# Patient Record
Sex: Female | Born: 1970 | Race: White | Hispanic: No | Smoking: Current every day smoker
Health system: Southern US, Community
[De-identification: ages and names within clinical notes are randomized; demographics above are authoritative.]

## PROBLEM LIST (undated history)

## (undated) DIAGNOSIS — K219 Gastro-esophageal reflux disease without esophagitis: Secondary | ICD-10-CM

## (undated) DIAGNOSIS — J45909 Unspecified asthma, uncomplicated: Secondary | ICD-10-CM

## (undated) DIAGNOSIS — N329 Bladder disorder, unspecified: Secondary | ICD-10-CM

## (undated) DIAGNOSIS — M1990 Unspecified osteoarthritis, unspecified site: Secondary | ICD-10-CM

## (undated) DIAGNOSIS — N809 Endometriosis, unspecified: Secondary | ICD-10-CM

## (undated) DIAGNOSIS — N83209 Unspecified ovarian cyst, unspecified side: Secondary | ICD-10-CM

## (undated) DIAGNOSIS — L309 Dermatitis, unspecified: Secondary | ICD-10-CM

## (undated) DIAGNOSIS — I639 Cerebral infarction, unspecified: Secondary | ICD-10-CM

## (undated) DIAGNOSIS — E079 Disorder of thyroid, unspecified: Secondary | ICD-10-CM

## (undated) DIAGNOSIS — K5900 Constipation, unspecified: Secondary | ICD-10-CM

## (undated) DIAGNOSIS — N390 Urinary tract infection, site not specified: Secondary | ICD-10-CM

## (undated) DIAGNOSIS — T7840XA Allergy, unspecified, initial encounter: Secondary | ICD-10-CM

## (undated) DIAGNOSIS — N3090 Cystitis, unspecified without hematuria: Secondary | ICD-10-CM

## (undated) HISTORY — DX: Endometriosis, unspecified: N80.9

## (undated) HISTORY — DX: Bladder disorder, unspecified: N32.9

## (undated) HISTORY — PX: TUBAL LIGATION: SHX77

## (undated) HISTORY — PX: BLADDER SURGERY: SHX569

## (undated) HISTORY — DX: Cystitis, unspecified without hematuria: N30.90

## (undated) HISTORY — PX: LAPAROSCOPY: SHX197

## (undated) HISTORY — DX: Unspecified ovarian cyst, unspecified side: N83.209

## (undated) HISTORY — DX: Dermatitis, unspecified: L30.9

## (undated) HISTORY — DX: Urinary tract infection, site not specified: N39.0

## (undated) HISTORY — DX: Disorder of thyroid, unspecified: E07.9

## (undated) HISTORY — DX: Allergy, unspecified, initial encounter: T78.40XA

## (undated) HISTORY — DX: Unspecified osteoarthritis, unspecified site: M19.90

## (undated) HISTORY — DX: Unspecified asthma, uncomplicated: J45.909

## (undated) HISTORY — DX: Gastro-esophageal reflux disease without esophagitis: K21.9

## (undated) HISTORY — DX: Constipation, unspecified: K59.00

## (undated) HISTORY — DX: Cerebral infarction, unspecified: I63.9

---

## 1998-06-11 ENCOUNTER — Inpatient Hospital Stay (HOSPITAL_COMMUNITY): Admission: RE | Admit: 1998-06-11 | Discharge: 1998-06-11 | Payer: Self-pay | Admitting: Obstetrics

## 1999-05-26 ENCOUNTER — Inpatient Hospital Stay (HOSPITAL_COMMUNITY): Admission: AD | Admit: 1999-05-26 | Discharge: 1999-05-26 | Payer: Self-pay | Admitting: Obstetrics

## 2001-12-10 ENCOUNTER — Encounter: Payer: Self-pay | Admitting: Emergency Medicine

## 2001-12-10 ENCOUNTER — Emergency Department (HOSPITAL_COMMUNITY): Admission: EM | Admit: 2001-12-10 | Discharge: 2001-12-10 | Payer: Self-pay | Admitting: Emergency Medicine

## 2004-05-17 ENCOUNTER — Emergency Department: Payer: Self-pay | Admitting: Emergency Medicine

## 2004-12-16 ENCOUNTER — Ambulatory Visit (HOSPITAL_COMMUNITY): Admission: RE | Admit: 2004-12-16 | Discharge: 2004-12-16 | Payer: Self-pay | Admitting: Gynecology

## 2006-07-30 ENCOUNTER — Emergency Department: Payer: Self-pay | Admitting: Emergency Medicine

## 2006-08-01 ENCOUNTER — Emergency Department: Payer: Self-pay | Admitting: Emergency Medicine

## 2007-04-08 ENCOUNTER — Emergency Department: Payer: Self-pay | Admitting: Emergency Medicine

## 2007-05-01 ENCOUNTER — Emergency Department (HOSPITAL_COMMUNITY): Admission: EM | Admit: 2007-05-01 | Discharge: 2007-05-02 | Payer: Self-pay | Admitting: Emergency Medicine

## 2007-06-14 ENCOUNTER — Encounter (INDEPENDENT_AMBULATORY_CARE_PROVIDER_SITE_OTHER): Payer: Self-pay | Admitting: Nurse Practitioner

## 2007-06-14 ENCOUNTER — Ambulatory Visit: Payer: Self-pay | Admitting: Internal Medicine

## 2007-06-14 LAB — CONVERTED CEMR LAB
Alkaline Phosphatase: 51 units/L (ref 39–117)
Creatinine, Ser: 0.76 mg/dL (ref 0.40–1.20)
Eosinophils Absolute: 0.1 10*3/uL (ref 0.0–0.7)
Eosinophils Relative: 2 % (ref 0–5)
Glucose, Bld: 87 mg/dL (ref 70–99)
HCT: 37.3 % (ref 36.0–46.0)
Hemoglobin: 12.3 g/dL (ref 12.0–15.0)
Lymphs Abs: 1.7 10*3/uL (ref 0.7–4.0)
MCV: 94.7 fL (ref 78.0–100.0)
Monocytes Absolute: 0.5 10*3/uL (ref 0.1–1.0)
Platelets: 261 10*3/uL (ref 150–400)
RDW: 12.5 % (ref 11.5–15.5)
Sodium: 138 meq/L (ref 135–145)
TSH: 0.801 microintl units/mL (ref 0.350–5.50)
Total Bilirubin: 0.2 mg/dL — ABNORMAL LOW (ref 0.3–1.2)
Total Protein: 6.7 g/dL (ref 6.0–8.3)

## 2007-08-03 ENCOUNTER — Ambulatory Visit: Payer: Self-pay | Admitting: Family Medicine

## 2007-12-28 ENCOUNTER — Ambulatory Visit: Payer: Self-pay | Admitting: Obstetrics & Gynecology

## 2007-12-28 ENCOUNTER — Encounter: Payer: Self-pay | Admitting: Obstetrics & Gynecology

## 2007-12-31 ENCOUNTER — Ambulatory Visit (HOSPITAL_COMMUNITY): Admission: RE | Admit: 2007-12-31 | Discharge: 2007-12-31 | Payer: Self-pay | Admitting: Family Medicine

## 2008-03-28 ENCOUNTER — Inpatient Hospital Stay (HOSPITAL_COMMUNITY): Admission: AD | Admit: 2008-03-28 | Discharge: 2008-03-28 | Payer: Self-pay | Admitting: Obstetrics & Gynecology

## 2008-03-31 ENCOUNTER — Inpatient Hospital Stay (HOSPITAL_COMMUNITY): Admission: AD | Admit: 2008-03-31 | Discharge: 2008-03-31 | Payer: Self-pay | Admitting: Obstetrics & Gynecology

## 2008-04-09 ENCOUNTER — Ambulatory Visit: Payer: Self-pay | Admitting: Obstetrics and Gynecology

## 2008-04-09 LAB — CONVERTED CEMR LAB
Clue Cells Wet Prep HPF POC: NONE SEEN
Trich, Wet Prep: NONE SEEN
WBC, Wet Prep HPF POC: NONE SEEN
Yeast Wet Prep HPF POC: NONE SEEN

## 2008-04-23 ENCOUNTER — Ambulatory Visit: Payer: Self-pay | Admitting: Obstetrics and Gynecology

## 2008-06-16 ENCOUNTER — Ambulatory Visit: Payer: Self-pay | Admitting: Obstetrics & Gynecology

## 2008-07-17 ENCOUNTER — Ambulatory Visit: Payer: Self-pay | Admitting: Obstetrics & Gynecology

## 2008-07-24 ENCOUNTER — Inpatient Hospital Stay (HOSPITAL_COMMUNITY): Admission: EM | Admit: 2008-07-24 | Discharge: 2008-07-28 | Payer: Self-pay | Admitting: Emergency Medicine

## 2008-07-28 ENCOUNTER — Ambulatory Visit: Payer: Self-pay | Admitting: Dentistry

## 2008-07-31 ENCOUNTER — Ambulatory Visit (HOSPITAL_COMMUNITY): Admission: RE | Admit: 2008-07-31 | Discharge: 2008-07-31 | Payer: Self-pay | Admitting: Dentistry

## 2009-11-28 ENCOUNTER — Ambulatory Visit: Payer: Self-pay | Admitting: Nurse Practitioner

## 2009-11-28 ENCOUNTER — Inpatient Hospital Stay (HOSPITAL_COMMUNITY): Admission: AD | Admit: 2009-11-28 | Discharge: 2009-11-28 | Payer: Self-pay | Admitting: Obstetrics & Gynecology

## 2010-04-30 ENCOUNTER — Inpatient Hospital Stay (HOSPITAL_COMMUNITY)
Admission: AD | Admit: 2010-04-30 | Discharge: 2010-04-30 | Payer: Self-pay | Source: Home / Self Care | Attending: Obstetrics & Gynecology | Admitting: Obstetrics & Gynecology

## 2010-05-03 ENCOUNTER — Ambulatory Visit
Admission: RE | Admit: 2010-05-03 | Discharge: 2010-05-03 | Payer: Self-pay | Source: Home / Self Care | Attending: Family Medicine | Admitting: Family Medicine

## 2010-05-03 ENCOUNTER — Other Ambulatory Visit
Admission: RE | Admit: 2010-05-03 | Discharge: 2010-05-03 | Payer: Self-pay | Source: Home / Self Care | Admitting: Family Medicine

## 2010-05-13 ENCOUNTER — Ambulatory Visit: Admit: 2010-05-13 | Payer: Self-pay | Admitting: Obstetrics & Gynecology

## 2010-05-17 ENCOUNTER — Ambulatory Visit: Admit: 2010-05-17 | Payer: Self-pay | Admitting: Family Medicine

## 2010-07-02 ENCOUNTER — Other Ambulatory Visit: Payer: Self-pay | Admitting: Family Medicine

## 2010-07-02 DIAGNOSIS — Z1231 Encounter for screening mammogram for malignant neoplasm of breast: Secondary | ICD-10-CM

## 2010-07-12 LAB — DIFFERENTIAL
Basophils Relative: 0 % (ref 0–1)
Eosinophils Absolute: 0.1 10*3/uL (ref 0.0–0.7)
Eosinophils Relative: 1 % (ref 0–5)
Lymphs Abs: 3 10*3/uL (ref 0.7–4.0)
Neutrophils Relative %: 58 % (ref 43–77)

## 2010-07-12 LAB — CBC
MCH: 32.1 pg (ref 26.0–34.0)
MCHC: 34.2 g/dL (ref 30.0–36.0)
MCV: 93.7 fL (ref 78.0–100.0)
Platelets: 202 10*3/uL (ref 150–400)
RDW: 12.8 % (ref 11.5–15.5)

## 2010-07-12 LAB — GC/CHLAMYDIA PROBE AMP, GENITAL
Chlamydia, DNA Probe: NEGATIVE
GC Probe Amp, Genital: NEGATIVE

## 2010-07-12 LAB — URINALYSIS, ROUTINE W REFLEX MICROSCOPIC
Bilirubin Urine: NEGATIVE
Hgb urine dipstick: NEGATIVE
Ketones, ur: NEGATIVE mg/dL
Nitrite: NEGATIVE
Protein, ur: NEGATIVE mg/dL
Specific Gravity, Urine: 1.01 (ref 1.005–1.030)
Urobilinogen, UA: 0.2 mg/dL (ref 0.0–1.0)

## 2010-07-12 LAB — WET PREP, GENITAL

## 2010-07-14 ENCOUNTER — Ambulatory Visit (HOSPITAL_COMMUNITY)
Admission: RE | Admit: 2010-07-14 | Discharge: 2010-07-14 | Disposition: A | Discharge status: Home / Self Care | Payer: Self-pay | Source: Ambulatory Visit | Attending: Family Medicine | Admitting: Family Medicine

## 2010-07-14 DIAGNOSIS — Z1231 Encounter for screening mammogram for malignant neoplasm of breast: Secondary | ICD-10-CM

## 2010-07-16 ENCOUNTER — Other Ambulatory Visit: Payer: Self-pay | Admitting: Family Medicine

## 2010-07-16 DIAGNOSIS — R928 Other abnormal and inconclusive findings on diagnostic imaging of breast: Secondary | ICD-10-CM

## 2010-07-23 ENCOUNTER — Ambulatory Visit
Admission: RE | Admit: 2010-07-23 | Discharge: 2010-07-23 | Disposition: A | Discharge status: Home / Self Care | Payer: Self-pay | Source: Ambulatory Visit | Attending: Family Medicine | Admitting: Family Medicine

## 2010-07-23 DIAGNOSIS — R928 Other abnormal and inconclusive findings on diagnostic imaging of breast: Secondary | ICD-10-CM

## 2010-07-26 ENCOUNTER — Other Ambulatory Visit: Payer: Self-pay

## 2010-08-12 LAB — CBC
HCT: 29.4 % — ABNORMAL LOW (ref 36.0–46.0)
HCT: 35.2 % — ABNORMAL LOW (ref 36.0–46.0)
Hemoglobin: 10.4 g/dL — ABNORMAL LOW (ref 12.0–15.0)
Hemoglobin: 10.9 g/dL — ABNORMAL LOW (ref 12.0–15.0)
Hemoglobin: 12.3 g/dL (ref 12.0–15.0)
MCHC: 34.8 g/dL (ref 30.0–36.0)
MCHC: 34.9 g/dL (ref 30.0–36.0)
MCHC: 35 g/dL (ref 30.0–36.0)
MCV: 95.6 fL (ref 78.0–100.0)
RBC: 3.14 MIL/uL — ABNORMAL LOW (ref 3.87–5.11)
RBC: 3.26 MIL/uL — ABNORMAL LOW (ref 3.87–5.11)
RBC: 3.69 MIL/uL — ABNORMAL LOW (ref 3.87–5.11)
RDW: 12.6 % (ref 11.5–15.5)
RDW: 12.7 % (ref 11.5–15.5)
WBC: 8.6 10*3/uL (ref 4.0–10.5)

## 2010-08-12 LAB — URINALYSIS, ROUTINE W REFLEX MICROSCOPIC
Glucose, UA: NEGATIVE mg/dL
Ketones, ur: NEGATIVE mg/dL
Protein, ur: NEGATIVE mg/dL
Urobilinogen, UA: 0.2 mg/dL (ref 0.0–1.0)

## 2010-08-12 LAB — BASIC METABOLIC PANEL
BUN: 8 mg/dL (ref 6–23)
CO2: 22 mEq/L (ref 19–32)
CO2: 28 mEq/L (ref 19–32)
Calcium: 8.8 mg/dL (ref 8.4–10.5)
Chloride: 111 mEq/L (ref 96–112)
Creatinine, Ser: 0.66 mg/dL (ref 0.4–1.2)
Creatinine, Ser: 0.72 mg/dL (ref 0.4–1.2)
GFR calc Af Amer: >60 mL/min (ref >60)
Glucose, Bld: 86 mg/dL (ref 70–99)
Sodium: 138 mEq/L (ref 135–145)

## 2010-08-12 LAB — CULTURE, ROUTINE-ABSCESS

## 2010-08-12 LAB — FOLATE: Folate: 15.1 ng/mL

## 2010-08-12 LAB — GRAM STAIN

## 2010-08-12 LAB — DIFFERENTIAL
Basophils Relative: 0 % (ref 0–1)
Eosinophils Relative: 1 % (ref 0–5)
Lymphocytes Relative: 18 % (ref 12–46)
Monocytes Absolute: 0.9 10*3/uL (ref 0.1–1.0)
Monocytes Relative: 9 % (ref 3–12)
Neutro Abs: 7.5 10*3/uL (ref 1.7–7.7)

## 2010-08-12 LAB — COMPREHENSIVE METABOLIC PANEL
ALT: 10 U/L (ref 0–35)
Alkaline Phosphatase: 40 U/L (ref 39–117)
BUN: 9 mg/dL (ref 6–23)
CO2: 24 mEq/L (ref 19–32)
Chloride: 112 mEq/L (ref 96–112)
GFR calc non Af Amer: >60 mL/min (ref >60)
Glucose, Bld: 80 mg/dL (ref 70–99)
Potassium: 3.4 mEq/L — ABNORMAL LOW (ref 3.5–5.1)
Sodium: 140 mEq/L (ref 135–145)
Total Bilirubin: 0.5 mg/dL (ref 0.3–1.2)
Total Protein: 5.2 g/dL — ABNORMAL LOW (ref 6.0–8.3)

## 2010-08-12 LAB — RETICULOCYTES
RBC.: 3.26 MIL/uL — ABNORMAL LOW (ref 3.87–5.11)
Retic Ct Pct: 1.2 % (ref 0.4–3.1)

## 2010-08-12 LAB — ANAEROBIC CULTURE

## 2010-08-12 LAB — HEMOGLOBIN AND HEMATOCRIT, BLOOD
HCT: 37.3 % (ref 36.0–46.0)
Hemoglobin: 12.7 g/dL (ref 12.0–15.0)

## 2010-08-12 LAB — IRON AND TIBC: Saturation Ratios: 17 % — ABNORMAL LOW (ref 20–55)

## 2010-09-14 NOTE — Op Note (Signed)
[name], [name]            ACCOUNT NO.:  [id number]   MEDICAL RECORD NO.:  [id number]          PATIENT TYPE:  AMB   LOCATION:  DAY                          FACILITY:  [facility]   PHYSICIAN:  Charlynne Pander, D.D.S.DATE OF BIRTH:  1970/12/14   DATE OF PROCEDURE:  DATE OF DISCHARGE:                               OPERATIVE REPORT   PREOPERATIVE DIAGNOSES:  1. History of left facial swelling.  2. History of periapical abscess  3. Apical periodontitis.  4. Chronic periodontitis.  5. Retained root segments.  6. Accretions.   POSTOPERATIVE DIAGNOSES:  1. History of left facial swelling.  2. History of periapical abscess  3. Apical periodontitis.  4. Chronic periodontitis.  5. Retained root segments.  6. Accretions.   OPERATION:  1. Extraction of tooth numbers 13, 16, 17 and 30 with alveoloplasty.  2. Gross debridement of the remaining dentition.   SURGEON:  Charlynne Pander, D.D.S.   ASSISTANT:  Public house manager (Sales executive).   ANESTHESIA:  General anesthesia via nasoendotracheal tube.   MEDICATIONS:  1. Clindamycin 600 mg IV prior to invasive dental procedures.  2. Local anesthesia with total utilization 2 carpules each containing      34 mg of lidocaine with 0.017 mg of epinephrine as well as 2      carpules each containing 9 mg of bupivacaine with 0.009 mg of      epinephrine.   SPECIMENS:  There were 4 teeth that were discarded.   DRAINS:  None.   CULTURES:  None.   ESTIMATED BLOOD LOSS:  Less than 50 mL.   FLUIDS:  Per anesthesia records.   COMPLICATIONS:  None.   INDICATIONS:  The patient was recently admitted with a history of left  facial swelling.  The patient had an incision and drainage procedure  performed by Dr. Beverlee Nims.  Dental consultation was requested to  evaluate and rule out dental etiology and to provide dental treatment as  indicated.  The patient was examined and treatment planned for multiple  extractions with  alveoloplasty and gross debridement of the remaining  dentition.  This treatment plan was formulated to decrease the risk and  complications associated with dental infection from further affecting  the patient's systemic health.   OPERATIVE FINDINGS:  The patient was examined in operating room #6.  The  teeth were identified for extraction.  Tooth numbers 13, 16, 17 and 30  were deemed to need extraction.  The patient noted be affected by a  history of periapical abscess, apical periodontitis, chronic  periodontitis, multiple retained root segments, chronic periodontitis  and accretions.  The aforementioned necessitated removal of the  indicated teeth with alveoloplasty and gross debridement of teeth.   DESCRIPTION OF PROCEDURE:  The patient was brought to the main operating  room #6.  The patient was then placed in supine position on the  operating room table.  General anesthesia was then induced via  nasoendotracheal tube.  The patient was then for prepped and draped in  the usual manner for a dental medicine procedure.  A time-out was  performed.  The patient was identified  and procedures were verified.  The throat pack was placed at this time.  The oral cavity was then  thoroughly examined, with findings noted above.  The patient was then  ready for the dental medicine procedure as follows:   Local anesthesia was administered sequentially with a total utilization  of 2 carpules each containing 34 mg of lidocaine with 0.017 mg of  epinephrine as well as 2 carpules each containing 9 mg of bupivacaine  with 0.009 mg of epinephrine.   The maxillary left quadrant was first approached.  The left quadrant was  anesthetized utilizing the lidocaine with epinephrine via infiltration  method.  The patient was then given bilateral inferior alveolar nerve  blocks utilizing the bupivacaine with epinephrine.  Further infiltration  was then achieved utilizing lidocaine with epinephrine.   The  maxillary left quadrant was then approached.  A 15-blade incision  was made sulcularly on the buccal and palatal aspects of tooth #13.  A  surgical flap was then carefully reflected.  Appropriate amounts of  buccal and interseptal bone were removed with the surgical handpiece and  bur and copious amounts of sterile saline.  The coronal aspect of tooth  #13 was then removed at this time.  This left retained roots.  These  roots were then elevated out with a root tip pick after further bone  removal.  Alveoplasty was then performed utilizing rongeurs and bone  file.  The surgical site was then irrigated with copious amounts of  sterile saline.  The surgical site was then closed from the mesial of  #14 extended to the distal of #12.   At this point in time tooth #16 was approached.  This tooth was then  elevated with a series of straight elevators.  The coronal aspect was  then removed with a 150 forceps.  This left retained roots.  A flap was  then reflected appropriately to expose the bone and bone was removed  with a surgical handpiece and bur and copious amounts of sterile saline.  The roots were then elevated out with a root tip pick appropriately.  Alveoloplasty was then performed utilizing rongeurs and bone file.  The  surgical site was then irrigated with copious amounts of sterile saline.  The surgical site was then closed from the distal tuberosity and  extended to the distal of #15 utilizing 3-0 chromic gut suture in a  continuous interrupted suture technique x1.   At this point in time the mandibular left quadrant was approached.  A 15-  blade incision was then made from the distal #17 and extended to the  mesial #18.  The surgical flap was then carefully reflected on the  buccal and lingual aspects.  Bone was removed around the retained tooth  #17 at this time.  The tooth was then subluxated and eventually removed  with a 151 forceps without complications.  Alveoplasty was  then  performed utilizing rongeurs and bone file.  The surgical site was then  irrigated with copious amounts of sterile saline.  The surgical site was  then closed utilizing 3-0 chromic gut suture in a continuous interrupted  suture technique from the distal #17 and extended to the distal #18.   At this point in time tooth #30 was approached.  A 15-blade incision was  then made from the mesial of #32 and extended to the distal of #29.  A  surgical flap was then carefully reflected.  Appropriate amounts of bone  were then removed around  the roots associated with tooth #30.  These  roots were then subluxated and removed individually with a 151 forceps  without complications.  Alveoplasty was then performed utilizing  rongeurs and bone file.  The surgical site was then irrigated with  copious amounts of sterile saline.  The tissues were approximated and  trimmed appropriately.  The surgical site was then closed from the  mesial #32 and extended to the distal #29 utilizing 3-0 chromic gut  suture in a continuous interrupted suture technique x1.  Please note  tooth #32 does have distal caries and will need a dental restoration but  it was felt that this tooth could be saved for a future crown and bridge  restoration.   At this point in time the remaining teeth were approached.  A KaVo sonic  scaler was used to remove significant accretions.  A series of hand  curettes were then utilized to refine the removal of accretions.  The  KaVo sonic scaler was then again utilized to remove further accretions.  At this point in time the entire mouth was irrigated with copious  amounts of sterile saline.  The patient was examined for complications,  seeing none, the dental medicine procedure was deemed to be complete.  The throat pack was removed at this time.  The patient was then handed  off to the anesthesia team for final disposition.  After an appropriate  amount of time the patient was extubated  and taken to the post  anesthesia care unit with stable vital signs and good oxygenation level.  All counts were correct for the dental medicine procedure.  The patient  will be seen in approximately 1 week for evaluation for suture removal.  The patient is to continue antibiotic therapy as prescribed by Dr.  Waymon Amato and complete as indicated.  The patient has been given a pain  medication prescription by Dr. Waymon Amato, which she will fill at this  time.      Charlynne Pander, D.D.S.  Electronically Signed     RFK/MEDQ  D:  07/31/2008  T:  07/31/2008  Job:  [id number]   cc:   Marcellus Scott, MD   Jefry H. [name], MD  Fax: [phone]   Charlynne Pander, D.D.S.  Fax: [phone]

## 2010-09-14 NOTE — Op Note (Signed)
NAMESHAWAN, [name]            ACCOUNT NO.:  [id number]   MEDICAL RECORD NO.:  [id number]          PATIENT TYPE:  WOC   LOCATION:  WOC                          FACILITY:  [facility]   PHYSICIAN:  Jefry H. [name], MD     DATE OF BIRTH:  1970-08-06   DATE OF PROCEDURE:  07/24/2008  DATE OF DISCHARGE:  07/17/2008                               OPERATIVE REPORT   PREOPERATIVE DIAGNOSIS:  Left side mandibular facial abscess odontogenic  in origin.   POSTOPERATIVE DIAGNOSIS:  Left side mandibular facial abscess  odontogenic in origin.   PROCEDURE:  Incision and drainage of left facial abscess.   SURGEON:  Jefry H. [name], MD   ANESTHESIA:  General endotracheal anesthesia was used.   COMPLICATIONS:  None.   FINDINGS:  Large abscess cavity filled with purulent liquid.   The sample was sent for culture and sensitivity testing and stat Gram  stain.  Multiple severely diseased molars bilaterally and other teeth as  well.  The left posterior molar on the mandible was fractured, carious  and with some surrounding inflammation of the gingiva.   HISTORY:  This is a 40 year old lady with a long history of chronic  dental disease who has been unable to have this care for has had some  swelling of the jaw last week or so especially bad in the last 24 hours.  Panorex showed multiple periapical lucencies.  Risks, benefits, and  alternative complications of the procedure were explained to the patient  who seemed to understand and agreed to surgery.   PROCEDURE:  The patient was taken to the operating room and placed on  the operating table in supine position.  Following induction of general  endotracheal anesthesia, the left side of the face was prepped and  draped in a standard fashion.  Xylocaine with epinephrine was  infiltrated into the skin overlying the abscess.  A #15 blade was used  to create a transverse incision directly into the abscess.  The incision  was about a centimeter and half in  length.  There was immediate  outpouring of pus.  Samples were sent for culture and sensitivity  testing.  The abscess cavity was widely opened and the Morse-Andrews  suction was used to completely clean out the entire abscess cavity.  It  was then irrigated with saline using a large syringe.  A quarter inch  Penrose was placed into the depths of the wound  exited through the skin and secured in place with a nylon suture and a  safety pin.  Gauze dressing and elastic burn netting was used to hold  the dressing in place.  The patient was then awakened, extubated and  transferred to recovery in stable condition.      Jefry H. [name], MD  Electronically Signed     JHR/MEDQ  D:  07/25/2008  T:  07/26/2008  Job:  [id number]

## 2010-09-14 NOTE — H&P (Signed)
[name], [name]            ACCOUNT NO.:  [id number]   MEDICAL RECORD NO.:  [id number]          PATIENT TYPE:  INP   LOCATION:                               FACILITY:  [facility]   PHYSICIAN:  Vania Rea, M.D. DATE OF BIRTH:  [birth year]   DATE OF ADMISSION:  07/24/2008  DATE OF DISCHARGE:                              HISTORY & PHYSICAL   CHIEF COMPLAINT:  Left jaw swelling and pain.   HISTORY OF PRESENT ILLNESS:  This is a 41 year old white woman with past  medical history of dental caries and lack of dental care secondary to  lack of insurance with left mandibular tooth pain and swelling on and  off for the last approximately 6 months.  She had a tooth abscess  mentioned in an OB/GYN note dating August 2009.  She started to have  swelling and redness 4-5 days ago in the left mandibular region.  Her  pain increased, so she came to the ED.  Dr. Serena Colonel with ENT saw her  in the ED and will take her to I and D today.  The patient now in PACU  preparing for surgery.   PAST MEDICAL HISTORY:  Bilateral tubal ligation, uterine prolapse,  questionable endometriosis with chronic pelvic pain, and tooth abscess  as mentioned above.   MEDICATIONS AT HOME:  None except ibuprofen in the last several days.   MEDICATIONS IN THE EMERGENCY DEPARTMENT:  Zofran, vancomycin, Percocet,  and clindamycin.   ALLERGIES:  SULFA gives her hives, PENICILLIN gives her hives.   SOCIAL HISTORY:  She smokes half a pack a day for the last 20 years.   REVIEW OF SYSTEMS:  No fever or chills, vomiting, chest pain, shortness  of breath, leg swelling, dysuria.  Has chronic nausea and has acid  reflux.   PHYSICAL EXAMINATION:  VITAL SIGNS:  Blood pressure 129/87, pulse rate  88, respirations 16, temperature 97.7, and oxygen saturation 100% on  room air.  GENERAL:  Afebrile.  She is in no acute distress.  HEENT:  PERRLA.  Abscess on the left side of the jaw, but she can open  her mouth, swelling  around the abscess approximately at 5 cm diameter.  PULMONARY:  Clear to auscultation bilaterally.  CARDIAC:  Regular rate and rhythm.  No murmurs, rubs, or gallops.  GASTROINTESTINAL:  Soft, nontender, nondistended.  Bowel sounds  positive.  EXTREMITIES:  No edema.  NEUROLOGIC:  Nonfocal.   LABORATORY DATA:  X-ray, orthopantogram, extensive dental caries of left  mandibular wisdom tooth and abnormal periapical lucency near this, and  other dental roots, consider subperiosteal dental abscess in this  region.  White count 10.5 with 72% PMNs, hemoglobin 12.3, MCV 95.2, and platelets  196.   ASSESSMENT AND PLAN:  This is a 40 year old white woman with past  medical history of dental caries and with lack of dental care presenting  with periosteal dental abscess.  1. Dental abscess with extension to periosteum (per orthopantogram),      secondary to unresolved chronic left wisdom tooth infection.  She      received vancomycin and clindamycin in the  ED and will continue to      receive Percocet for pain control in the ED and will continue along      with morphine IV for pain.  We will also use Flora-Q to attenuate      gut flora depletion by the antibiotic.  We will also use ibuprofen      (with Protonix) for swelling and pain, and Tylenol for fever.  Dr.      [name] saw the patient in the ED and will take her to surgery      tonight for I and D.  The patient will also need dental consult      with Dr. Verdie Shire in a.m.  We will admit her to regular floor.  We      will hydrate her and keep her n.p.o. for now.  2. Tobacco use.  We will offer nicotine patch.  3. Prophylaxis.  We will place her on SCDs and Protonix.   The case was discussed with Dr. Orvan Falconer.      Carlus Pavlov, M.D.  Electronically Signed      Vania Rea, M.D.  Electronically Signed    CG/MEDQ  D:  07/24/2008  T:  07/25/2008  Job:  [id number]

## 2010-09-14 NOTE — Group Therapy Note (Signed)
[name], [name]            ACCOUNT NO.:  [id number]   MEDICAL RECORD NO.:  [id number]          PATIENT TYPE:  WOC   LOCATION:  [facility]                   FACILITY:  [facility]   PHYSICIAN:  Scheryl Darter, MD       DATE OF BIRTH:  03/02/1971   DATE OF SERVICE:                                  CLINIC NOTE   HISTORY:  The patient returns today for followup from pessary placement.  The patient was seen by Dr. Okey Dupre April 23, 2008.  She has a pessary  in place for pelvic prolapse.  She says that sometimes it causes some  discomfort and she feels it is shifting in position.  Last period was  June 23, 2008.  The patient has problems with constipation, pelvic  pain, urinary frequency, nocturia, urge.  She has some numbness in her  legs and pain in her lower back and buttocks.  She consulted Dr. Marice Potter on  June 16, 2008 and it was not felt that hysterectomy was likely to be  of benefit with her symptoms.  She states that Dr. Mia Creek had  diagnosed endometriosis in the past and she had a laparoscopy.  Reviewing her records, I see no biopsy evidence of endometriosis.   I performed pelvic exam and removed her pessary.  She did not have any  abnormal discharge.  There was some blood which may represent onset of  her menses.  She has minimal relaxation of the vaginal vault anteriorly  and posteriorly.  The uterus appears to be well supported.  Upon  standing, there is perhaps a first-degree cystocele.   We discussed her constellation of symptoms.  We elected to keep the  pessary out today.  As she has significant urinary symptoms and she may  have interstitial cystitis, I made an  urology referral.  I recommended  that she take Metamucil or some other bulk-forming laxative on a daily  basis.  She will return to review her symptoms in 4 weeks.      Scheryl Darter, MD     JA/MEDQ  D:  07/17/2008  T:  07/17/2008  Job:  [id number]

## 2010-09-14 NOTE — Group Therapy Note (Signed)
[name], Vargas            ACCOUNT NO.:  [id number]   MEDICAL RECORD NO.:  [id number]          PATIENT TYPE:  WOC   LOCATION:  [facility]                   FACILITY:  [facility]   PHYSICIAN:  Allie Bossier, MD        DATE OF BIRTH:  1971/01/18   DATE OF SERVICE:  06/16/2008                                  CLINIC NOTE   [name] is a 40 year old G2 P2 thin lady who was seen by Dr. Okey Dupre  on 04/09/2008 for followup of her MAU visit.  She was seen in the MAU  twice in the latter part of 2009 for pain in her pelvis as well as  chronic vulvar burning.  This is also vaginal burning.  Please note  that Dr. Mia Creek had seen the patient in the past and had done a  laparoscopy and said that she had superficial endometriosis.  There is  no pathology report to support this.  She has been given a pessary at 1  point, but Dr. Okey Dupre increased the size of the pessary and gave her  another #7 to see if that would help her complaint of uterine prolapse.  He felt that she had second-degree uterine prolapse.  He wrote that he  thought that she would do well with a TAH-BSO if her problem is truly  secondary to endometriosis.  He told her that she might be helped by a  tricyclic antidepressant, however, she has not done that.  When I first  came in the room, she said that her pessary has helped her periods to  be regular and now her bladder function is normal.  She is in fact  scheduled to have a hysterectomy by me tomorrow.  Please note that I did  not schedule the surgery, but it was scheduled by Dr. Okey Dupre.  Upon  further review of systems, she tells me she has numbness in her legs for  the last approximately 6 weeks.  She says that when she sit straight up,  she has a stabbing pain in her butt and she also complains of lower  back pain.  She is somewhat fixated on the fact that Dr. Mia Creek told  her that she has endometriosis.  She is afraid that the endometriosis is  causing her long-term harm.  I  have explained at length that the  endometriosis that he diagnosed visually was listed as mild and that she  should not be having any long-term complications from this.  Please note  Dr. Mia Creek gave her 72 Percocet as a renewal on her standard Percocet  use.  After my neurologic exam and discussion with [name], I am  not at all convinced that a hysterectomy and bilateral salpingo-  oophorectomy would offer her any benefit whatsoever.  I feel that the  majority of her pelvic pain is from a disk injury or disk disease and  especially the vulva at the burning in her vulva as well as the  numbness in her size as well as her back pain as well as her pelvic  pain.  I have refused to operate on her unless we  do a trial of Lupron  and only I would operate on her if the Lupron gave her significant  relief of her pain.  She declines the Lupron and in fact wants to see  another surgeon, I have certainly supported this decision and I have  cancelled her surgery for tomorrow.      Allie Bossier, MD     MCD/MEDQ  D:  06/16/2008  T:  06/17/2008  Job:  [id number]

## 2010-09-14 NOTE — Discharge Summary (Signed)
[name], DANIS            ACCOUNT NO.:  [id number]   MEDICAL RECORD NO.:  [id number]          PATIENT TYPE:  INP   LOCATION:  5151                         FACILITY:  [facility]   PHYSICIAN:  Marcellus Scott, MD     DATE OF BIRTH:  1970-05-19   DATE OF ADMISSION:  07/24/2008  DATE OF DISCHARGE:  07/28/2008                               DISCHARGE SUMMARY   PRIMARY MEDICAL DOCTOR:  Gentry Fitz.   DISCHARGE DIAGNOSES:  1. Left perimandibular odontogenic abscess, status post incision and      drainage.  2. Dental caries.  For outpatient extraction.  3. Anemia.  4. Tobacco abuse.  5. Hypokalemia, repleted.   DISCHARGE MEDICATIONS:  1. Clindamycin 300 mg p.o. q.i.d. for 1 week.  2. Florastor 250 mg p.o. daily for 1 week.  3. Motrin 200 mg tablets 2 p.o. t.i.d. with meals p.r.n. for mild      pain.  4. Percocet 5/325, one to 2 tablets p.o. q.i.d. p.r.n. for moderate to      severe pain, 30 tablets prescribed.   PROCEDURES:  1. Incision and drainage of left facial abscess by Dr. [name] on July 24, 2008.  2. Orthopantogram:  Impression:  Multifocal dental caries and abnormal      periapical dental lucencies.  On the left, mandibular wisdom tooth      involved, consider subperiosteal dental abscess at this level.  In      light of the clinical history, a dental consult was recommended.   PERTINENT LABORATORIES:  Basic metabolic panel today unremarkable with  BUN 8, creatinine 0.66.  CBCs with hemoglobin 10.9, hematocrit 31.2,  white blood cells 6.8, platelets 195.  Abscess culture has been  reincubated and pending.  Anemia panel with absolute reticulocytes 39.1,  iron 40, total iron-binding capacity 230, percentage saturation 17,  vitamin B12 337, serum folate 15.1 and ferritin 64.  Hepatic panel was  unremarkable except for albumin of 2.8 and total protein of 5.2.  Urinalysis negative for features of urinary tract infection.  Gram-  stain:  Few WBCs, predominantly polymorphs.   Rare Gram-positive cocci in  pairs, rare Gram-negative rods and rare Gram-positive rods.   CONSULTATIONS:  1. ENT, Dr. [name] and Jenne Pane.  2. Oral surgery, Dr. Kristin Bruins.   HOSPITAL COURSE AND PATIENT DISPOSITION:  [name] is a pleasant 40-  year-old Caucasian female patient with history of dental caries and lack  of dental care secondary to unavailability of insurance, who has been  having left-sided mandibular tooth pain and swelling on and off, ongoing  for 6 months prior to admission..  She started having swelling and  redness 4-5 days prior to admission in the left mandibular region.  She  presented to the emergency room.  She was admitted by the hospitalist  service.  Dr. [name] took her to surgery and performed an incision and  drainage.  He placed a drain.  She was then admitted to the medical  floor.   1. Left perimandibular odontogenic abscess:  The patient was admitted      to the hospital.  As  indicated, she had incision and drainage done.      She was empirically placed on IV vancomycin and clindamycin.  With      these measures, her swelling, redness and pain progressively      continued to improve.  By the next day of surgery, her drain had      fallen out, which was removed.  Packing was done with a gauze.      Over the weekend, I spoke with Dr. Jenne Pane regarding her management.      Our concern was that because of the patient's lack of insurance and      primary medical doctor, she would have a tough time getting her      dental issues addressed.  If this was not addressed, she was likely      to return with recurrent abscess.  He recommended obtaining a      dental consult in house.  Over the weekend, however, after a lot of      effort I was unable to find oral surgeon on call.  I even discussed      this with the hospital administrator on call.  This morning, I was      able to reach Dr. Kristin Bruins who kindly saw the patient.  He      suggested that the earliest  that he would be able to operate on her      would be Thursday.  He indicated that this could be done as an      outpatient.  He discussed this with the patient who was agreeable      to this plan.  I had discussed this option with Dr. Jenne Pane this      morning, and he was also agreeable to this plan.  She will be      discharged on a week's course of clindamycin.  Dr. Luretha Murphy      office will call for preoperative testing.  She is scheduled to      have her surgery on July 31, 2008 at 7:30 a.m. at [facility].  The patient indicates that she will be able to purchase      her medications.  Also, I had spoken with Dr. Jenne Pane who had      indicated that she really does not need to be followed by them.  In      any event, she can call them on an as-needed basis.  He indicates      that gauze can be removed tomorrow and there is no need of      continuous packing.  2. Dental caries:  Management as per problem #1.  3. Anemia, which is probably of chronic disease versus iron deficiency      anemia:  The patient has been advised to follow up this as an      outpatient and will need to be evaluated.  Also to find a primary      medical doctor.  4. Tobacco abuse:  Cessation counseling done.  Patient declines the      nicotine patch.   The patient at this time is stable to discharge home and follow up with  Dr. Kristin Bruins as an outpatient.   Time taken in coordinating this discharge was 35 minutes.      Marcellus Scott, MD  Electronically Signed     AH/MEDQ  D:  07/28/2008  T:  07/28/2008  Job:  [id number]   cc:   Jefry H. [name], MD  Antony Contras, MD  Charlynne Pander, D.D.S.

## 2010-09-14 NOTE — Consult Note (Signed)
[name], [name]            ACCOUNT NO.:  [id number]   MEDICAL RECORD NO.:  [id number]          PATIENT TYPE:  INP   LOCATION:                               FACILITY:  [facility]   PHYSICIAN:  Charlynne Pander, D.D.S.DATE OF BIRTH:  [birth year]   DATE OF CONSULTATION:  DATE OF DISCHARGE:                                 CONSULTATION   REFERRING PHYSICIAN:  Marcellus Scott, MD   [name] is a 40 year old female referred by Dr. Waymon Amato for  a dental consultation.  The patient was recently admitted with left  facial swelling and underwent an incision and drainage procedure by Dr.  Serena Colonel.  A dental consultation was then requested to rule out  dental etiology and provide treatment as indicated.   MEDICAL HISTORY:  1. History of left facial swelling.      a.     Status post incision and drainage procedure of a left facial       abscess by Dr. Serena Colonel on July 24, 2008.  The patient is       currently on IV antibiotic therapy.  2. History of uterine prolapse.  3. Status post bilateral tubal ligation.  4. Questionable endometriosis with chronic pelvic pain.   ALLERGIES/ADVERSE DRUG REACTION:  1. SULFA causes hives.  2. PENICILLIN causes hives.   MEDICATIONS:  1. Protonix 40 mg daily.  2. Nicotine patch daily.  3. Florastor 250 mg daily.  4. Vancomycin 1000 mg IV every 12 hours.  5. Clindamycin 600 mg IV every 6 hours.  6. Percocet 5/325 one to two every 4 hours as needed for pain.   SOCIAL HISTORY:  The patient is married with 2 children.  The patient  smokes approximately 1/2 pack per day for 20 years.   FUNCTIONAL ASSESSMENT:  The patient remains independent for ADLs.   FAMILY HISTORY:  Noncontributory.   REVIEW OF SYSTEMS:  This is reviewed from the chart for this admission.   DENTAL HISTORY:   CHIEF COMPLAINT:  The patient is complaining of history of toothache and  left facial swelling.   HISTORY OF PRESENT ILLNESS:  The patient gives a history  of toothache  involving a lower left molar since August 2009.  The pain has been  intermittent in nature.  Recently it worsened to the point where it  reached a 10/10 intensity.  The patient then sought treatment through  the emergency room and was admitted and an incision and drainage  procedure performed by Dr. [name].  The patient then placed on IV  antibiotic therapy and pain medication.  The patient indicates the pain  is currently 3/10 in intensity.  The patient points to tooth #17 as the  offending tooth but also is complaining of intermittent pain coming from  tooth #13 and 30.  The patient indicates that she last saw a dentist  approximately 3 years ago for an exam and cleaning by a dentist in  [place], [place].  The patient has been unable to see the  dentist due to economic concerns.   DENTAL EXAM:  GENERAL:  The patient is  a well-developed, well-nourished  female in no acute distress.  VITAL SIGNS:  Blood pressure is 112/67, pulse equal to 60, respirations  are 18, temperature is 98.4.  HEAD AND NECK EXAM:  Left neck and face has a bandage over it consistent  with previous incision and drainage procedure.  There is no significant  right neck lymphadenopathy noted at this time.  The patient denies  symptoms of trismus.  The patient is able to open approximately 30 to 40  mm.  The patient denies acute TMJ symptoms at this time.  INTRAORAL EXAM:  Patient with no evidence of intraoral abscess  formation.  DENTITION:  The patient is missing tooth #3 and 31.  Tooth #13 and 30  are present as retained root segments.  There are multiple dental caries  noted.  PERIODONTAL:  Patient with chronic periodontitis and plaque and calculus  accumulations, selective areas of gingival recession and tooth mobility.  DENTAL CARIES:  There are multiple dental caries noted.  The patient  would need a full series of dental radiographs to identify the extent of  the dental caries.   ENDODONTIC:  Patient with a history of acute pulpitis symptoms involving  the left mandible and facial area.  Patient with multiple areas of  periapical pathology involving tooth #13, 17 and 30.  CROWN AND BRIDGE:  There are no crown or bridge restorations noted.  PROSTHODONTIC:  The patient denies presence of partial dentures.  OCCLUSION:  Patient with a poor occlusal scheme secondary to multiple  missing teeth, supereruption and drifting of the unopposed teeth into  the edentulous areas and lack of replacement of the missing teeth with  dental prostheses.   RADIOGRAPHIC INTERPRETATION:  A panoramic x-ray was taken on July 24, 2008.   There are multiple missing teeth.  There are multiple retained root  segments.  There are dental caries noted.  There are multiple areas of  periapical pathology.  There is incipient to moderate bone loss noted.   ASSESSMENT:  1. History of left facial swelling with tooth #17 as the most likely      source of a dental etiology.  There is a periapical radiolucency at      the apices of tooth #17.  2. Chronic periodontitis.  3. Plaque and calculus accumulations.  4. Rampant dental caries.  5. Multiple areas of periapical pathology.  6. Multiple missing teeth.  7. Supereruption and drifting of the unopposed teeth into the      edentulous areas.  8. No history of partial dentures.  9. Poor occlusal scheme.  10.History of oral neglect.   PLAN/RECOMMENDATIONS:  1. I discussed the risks, benefits and complications of various      treatment options with the patient in relationship to her medical      and dental conditions.  We discussed various treatment options to      include no treatment, multiple extractions with alveoloplasty,      preprosthetic surgery as indicated, periodontal therapy, dental      restorations, root canal therapy, crown and bridge therapy, implant      therapy, replacing missing teeth as indicated.  The patient      currently  wishes to proceed with extraction of indicated teeth with      alveoloplasty and gross debridement of the remaining dentition.      This treatment will be provided as an outpatient through the      operating room at [facility].  I have talked with Dr.      Waymon Amato, and he will discharge the patient today from [facility]      and will be kept on oral antibiotic therapy.  The patient will then      be seen this Thursday, April 1, in the operating room at [facility] to proceed with extraction of the indicated teeth      with alveoloplasty and gross debridement of teeth.  2. Discussion of findings with Dr. [name], Dr. Waymon Amato and other      medical team as indicated.  3. The patient will then follow up with the dentist of her choice for      an exam, dental x-rays and overall treatment planning discussions.      Charlynne Pander, D.D.S.  Electronically Signed     RFK/MEDQ  D:  07/29/2008  T:  07/29/2008  Job:  [id number]   cc:   Marcellus Scott, MD  Jefry H. [name], MD  [facility]

## 2010-09-14 NOTE — Group Therapy Note (Signed)
NAMEADELAI, [name]            ACCOUNT NO.:  [id number]   MEDICAL RECORD NO.:  [id number]          PATIENT TYPE:  WOC   LOCATION:  [facility]                   FACILITY:  [facility]   PHYSICIAN:  Elsie Lincoln, MD      DATE OF BIRTH:  1970/12/17   DATE OF SERVICE:                                  CLINIC NOTE   The patient is a 40 year old para 2-0-2-2 who presents for multiple  complaints:  1. She has a tooth abscess and no dental insurance and no money to go      see a doctor.  2. She has severe abdominal pain.  She has been diagnosed by Dr.      Mia Creek 5 years ago at the [facility]  office with      endometriosis.  I do not have these records and we are getting      them.  She is to be on birth control pills which helped and      narcotics also helped the pain.  3. She has vaginal burning and suprapubic pain.  4. She has dyspareunia.  5. The patient denies dysuria when she urinates and she also does not      complain of frequency.  She does not believe that she has an      infection.   PAST MEDICAL HISTORY:  Pelvic pain, endometriosis, and denies other  medical problems.   PAST SURGICAL HISTORY:  She has had a laparoscopy, a bilateral tubal  ligation, and hysteroscopy.   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   MENSTRUAL HISTORY:  The patient has been having irregular periods for  many years approximately 2 a month.  She has also not had Pap smear in 5  years.  She has had abnormal Pap smears in 2002.  She does not admit to  any freezing or burning of her cervix.   HEALTHCARE MAINTENANCE:  She has never had a colonoscopy or a mammogram.   SOCIAL HISTORY:  The patient smokes half pack per day for 19 years.  She  has 1 alcoholic beverage a week.  She does not drink caffeinated  beverages.  She has never been physically or sexually abused.   MEDICATIONS:  Ibuprofen.   ALLERGIES:  No allergy to LATEX.  She  has an allergy to SULFA.  She does  not believe she has an allergy to PENICILLIN.   Systemic review is positive for numbness and weakness in fingers,  swelling in legs, muscle aches, fevers, night sweats, fatigue, frequent  headaches, dizzy spells, problems with nose and smell, coughing up  phlegm or blood, chest pain, problems urination, loss of urine while  coughing, sneezing, hot flashes, and pain with intercourse.  Today, we  are addressing some of her OB/GYN complaints.   PHYSICAL EXAMINATION:  VITAL SIGNS:  Temperature 97.0, pulse 69, blood  pressure 110/69, weight 172.8, and height 5 feet 3 inches.  HEENT:  Normocephalic.  There is swelling in the left lower jaw where  her teeth seems to be inflamed and it is tender.  No pus drain from her  gums.  CHEST:  Normal movements.  ABDOMEN:  Soft and nontender.  No rebound.  No organomegaly.  No hernia.  GENITALIA:  Tanner 5.  Vulva is excoriated or torn at the fourchette.  A  wet Q-tip test shows very burning at the vestibule and anteriorly  towards the urethra.  Vagina appear slightly atrophic.  There is mild  prolapse of the uterus and mild cystocele.  On bimanual, the uterus  appears still small, anteverted, and nontender.  Adnexa, left is more  tender than right, but no masses, no guarding.  The patient is most  tender suprapubically on the urethra and bladder.  EXTREMITIES:  Nontender.   ASSESSMENT AND PLAN:  A 40 year old female with pelvic pain,  endometriosis, vaginal burning, tooth abscess, dyspareunia, and abnormal  uterine bleeding.  1. Pap smear, wet prep, and GC chlamydia culture sent.  2. Urinalysis and urine culture sent.  3. Transvaginal ultrasound ordered.  4. The patient is to come back in 2 weeks for these results and also      for colposcopy of the vulva and endometrial biopsy.  This should be      a 1-hour appointment.  5. The patient was given amoxicillin and Vicodin for her tooth pain.      She understands  this a one-time deal.  This will not be repeated as      we are not dentist, but I understand that she does not have health      insurance or access to any dental care.  Again, she states she is      not allergic to penicillin.  6. If it is interstitial cystitis, and the urinalysis and urine      culture comes back negative, we will refer to Urology.  7. The patient could have some precancerous cell in her vulva.  She      does need a colposcopy, but also there could just be a vulvar      vestibulitis/vulvodynia.           ______________________________  Elsie Lincoln, MD     KL/MEDQ  D:  12/28/2007  T:  12/29/2007  Job:  [id number]

## 2010-09-14 NOTE — Group Therapy Note (Signed)
NAME:  [name], [name]            ACCOUNT NO.:  [id number]   MEDICAL RECORD NO.:  [id number]          PATIENT TYPE:  WOC   LOCATION:  [facility]                   FACILITY:  [facility]   PHYSICIAN:  Argentina Donovan, MD        DATE OF BIRTH:  02-Nov-1970   DATE OF SERVICE:  04/09/2008                                  CLINIC NOTE   The patient is a 40 year old para 2-0-2-2 Caucasian female who has been  in the [facility] twice within the last few weeks with severe lower abdominal  pain.  Was seen by one of the nurse practitioners who thought the  patient had a uterine prolapse and placed a pessary which gave the  patient a significant amount of relief.  She has also been troubled with  chronic vaginal burning inside of the vagina posterior and anterior  wall, and has a positive Q-tip test.  She had a history of abnormal Pap  smears in the past, had colposcopy and had some kind of treatment.  Dr.  Mia Creek had done some treatments on this patient and we do not have the  record at this point.  I am going to request that we get the record.  He  had treated the patient, did a laparoscopy and said she had  endometriosis according to the patient.   EXAMINATION:  On examination the patient does show signs of a second-  degree uterine prolapse which may have been what is giving her some  relief with a pessary.  Although the pessary was floating, I put one in  that was slightly larger, a 7 cm flat pessary to see if that would  improve her symptoms even to a greater extant until we could decide on  what procedure she needs as far as surgical.   My impression is that this patient would do well with a hysterectomy and  bilateral salpingo-oophorectomy if truly her problem is secondary to  endometriosis.  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  I have told her that maybe that would be helped by a  tricyclic type of antidepressant and we will consider doing that when  she comes back.  I am  going to have her come back in 2 weeks in order to  give [place] time to obtain the records and decide whether or not vaginal  hysterectomy with bilateral salpingo-oophorectomy or laparoscopic  vaginal hysterectomy with bilateral salpingo-oophorectomy might be the  better choice for this patient.  Although her pain is also suprapubic,  she does not have any of the other signs of interstitial cystitis, so I  do not think that that is really a question.  Her last Pap smear turned  out to be normal and she has no signs that I can see of any leukoplakial  changes on the vulva or inside the vagina.   IMPRESSION:  Chronic pelvic pain with vaginal vulvar burning, uterine  prolapse.           ______________________________  Argentina Donovan, MD     PR/MEDQ  D:  04/09/2008  T:  04/09/2008  Job:  [id number]

## 2010-09-17 NOTE — Op Note (Signed)
[name], [name]            ACCOUNT NO.:  [id number]   MEDICAL RECORD NO.:  [id number]          PATIENT TYPE:  AMB   LOCATION:  [facility]                           [facility]   PHYSICIAN:  Ginger Carne, MD  DATE OF BIRTH:  July 30, 1970   DATE OF PROCEDURE:  12/16/2004  DATE OF DISCHARGE:                                 OPERATIVE REPORT   PREOPERATIVE DIAGNOSIS:  Request for permanent sterilization.   POSTOPERATIVE DIAGNOSIS:  Request for permanent sterilization.   PROCEDURE:  Laparoscopic bilateral tubal banding.   SURGEON:  Ginger Carne, M.D.   ASSISTANT:  None.   COMPLICATIONS:  None immediate.   ESTIMATED BLOOD LOSS:  Minimal.   ANESTHESIA:  General with Marcaine 10 mL 0.5% 1:200,000 for subcuticular  injection.   SPECIMENS:  None.   FINDINGS:  Uterus, tubes, and ovaries demonstrate evidence of stage II  endometriosis which had been previously recognized.  There were punctate  lesions along the broad ligaments.  Both tubes were identified from their  isthmus to fimbriated ends and separate and apart from their respective  round ligaments.  External genitalia, vulva, and vagina were normal.   DESCRIPTION OF PROCEDURE:  The patient was prepped and draped in the usual  fashion and placed in the lithotomy position.  Betadine solution used for  antiseptic and the patient was catheterized prior to the procedure.  Afterward a vertical infraumbilical incision was made and the Veress needle  placed in the abdomen.  Opening and closing pressures were 10 to 15  mmHg.  Needle released and trocar placed through the same incision and laparoscope  placed in the trocar sleeve.  Under direct visualization, an 8 mm probe was  placed in the left lower quadrant for administration of said Falope rings.  Once again both tubes were carefully identified from their isthmus to  fimbriated ends separate and apart from their respective round ligaments.  Bands were placed on either  side at the isthmus ampullary junction, no  bleeding noted, and they were well applied.  Gas released, trocars removed,  and  closure of the 10 mm fascia site with 0 Vicryl suture and 4-0 Vicryl for  subcuticular closure.  Needle, sponge, and instrument counts correct.  The  patient tolerated the procedure well and return to the postanesthesia  recovery room in excellent condition.      Ginger Carne, MD  Electronically Signed     SHB/MEDQ  D:  12/16/2004  T:  12/16/2004  Job:  [id number]

## 2010-11-26 DIAGNOSIS — R102 Pelvic and perineal pain: Secondary | ICD-10-CM | POA: Insufficient documentation

## 2010-11-26 DIAGNOSIS — K5909 Other constipation: Secondary | ICD-10-CM

## 2010-11-26 DIAGNOSIS — N809 Endometriosis, unspecified: Secondary | ICD-10-CM | POA: Insufficient documentation

## 2010-11-26 DIAGNOSIS — N816 Rectocele: Secondary | ICD-10-CM

## 2010-11-26 DIAGNOSIS — IMO0002 Reserved for concepts with insufficient information to code with codable children: Secondary | ICD-10-CM

## 2010-12-03 ENCOUNTER — Telehealth: Payer: Self-pay | Admitting: Obstetrics and Gynecology

## 2010-12-03 NOTE — Telephone Encounter (Signed)
Patient wanted to state how important this appointment is for her because of her numerous complaints but she does not have insurance of any sort nor money to pay out of pocket. Advised patient that we are not turning her away and we provide financial aid. We will give her the paperwork at time of visit. Patient agrees.

## 2010-12-09 ENCOUNTER — Ambulatory Visit: Payer: Self-pay | Admitting: Family Medicine

## 2011-01-05 ENCOUNTER — Other Ambulatory Visit: Payer: Self-pay | Admitting: Family Medicine

## 2011-01-05 DIAGNOSIS — R928 Other abnormal and inconclusive findings on diagnostic imaging of breast: Secondary | ICD-10-CM

## 2011-01-13 ENCOUNTER — Telehealth: Payer: Self-pay | Admitting: *Deleted

## 2011-01-13 ENCOUNTER — Ambulatory Visit: Payer: Self-pay | Admitting: Obstetrics & Gynecology

## 2011-01-13 NOTE — Telephone Encounter (Signed)
Pt. Called today at 8:44 am and left a message she has an appointment scheduled for today at 2;45 to see doctor to schedule surgery- states she wants to come in, but has had a death in the family and can't come in- states she knows if she reschedules it will be a long while from now and she is in a lot of pain and doesn't want to wait along time again- states she has been told she has endometriosis and also a bulge in her rectum from childbirth.  Discussed with manager and appointment given for 02-16-11 at 2pm . Called pt to notify we can reschedule and appt given. Pt. States worried about putting it off because family member died from cervical cancer. Pt states did not receive her last pap result-notified pt last pap results negative in Jan 2012. Informed pt she has to decide which appointment she wants today or February 16, 2011 and she always has the option to come to [facility] anytime if she feels she needs to based on severe pain,etc. Pt. Decided to change to 2011-02-16 appt because of death in the family and has a lot of family commitments and understands she can come to [facility] if she feels she needs to before then.

## 2011-02-02 LAB — WET PREP, GENITAL: Clue Cells Wet Prep HPF POC: NONE SEEN

## 2011-02-02 LAB — URINALYSIS, ROUTINE W REFLEX MICROSCOPIC
Bilirubin Urine: NEGATIVE
Hgb urine dipstick: NEGATIVE
Protein, ur: NEGATIVE
Specific Gravity, Urine: 1.01
Urobilinogen, UA: 0.2

## 2011-02-02 LAB — GC/CHLAMYDIA PROBE AMP, GENITAL: Chlamydia, DNA Probe: NEGATIVE

## 2011-02-03 ENCOUNTER — Ambulatory Visit (INDEPENDENT_AMBULATORY_CARE_PROVIDER_SITE_OTHER): Payer: Self-pay | Admitting: Obstetrics and Gynecology

## 2011-02-03 ENCOUNTER — Encounter: Payer: Self-pay | Admitting: Obstetrics and Gynecology

## 2011-02-03 DIAGNOSIS — K5909 Other constipation: Secondary | ICD-10-CM

## 2011-02-03 DIAGNOSIS — K5900 Constipation, unspecified: Secondary | ICD-10-CM

## 2011-02-03 DIAGNOSIS — R109 Unspecified abdominal pain: Secondary | ICD-10-CM

## 2011-02-03 DIAGNOSIS — G8929 Other chronic pain: Secondary | ICD-10-CM

## 2011-02-03 DIAGNOSIS — N816 Rectocele: Secondary | ICD-10-CM

## 2011-02-03 DIAGNOSIS — IMO0002 Reserved for concepts with insufficient information to code with codable children: Secondary | ICD-10-CM

## 2011-02-03 DIAGNOSIS — R300 Dysuria: Secondary | ICD-10-CM

## 2011-02-03 DIAGNOSIS — N8111 Cystocele, midline: Secondary | ICD-10-CM

## 2011-02-03 DIAGNOSIS — N809 Endometriosis, unspecified: Secondary | ICD-10-CM

## 2011-02-03 NOTE — Progress Notes (Signed)
  Subjective:    Patient ID: [name], female    DOB: [birth year], 40 y.o.   MRN: [id number]  HPI 40 yo [id number] with LMP 01/31/2011 presenting today for follow-up on her endometriosis and requesting hysterectomy. Patient was last seen in January where a GYN exam was performed. Patient states that since her visit she continues to experience pelvic pain on and off. She continues to experience constipation and dysuria and frequency. Patient describes generalized pelvic pain that does not seem to be associated with her menses. Patient reports a long standing history of endometriosis for which she was medically managed with OCP but cannot afford to buy them.  Patient also reports h/o uterine prolase for which she was prescribed a pessary. Patient still has the pessary at home but has not used it a long time.   Review of Systems As stated in HPI, otherwise negative    Objective:   Physical Exam  GENERAL: Well-developed, well-nourished female in no acute distress.  HEENT: Normocephalic, atraumatic. Sclerae anicteric.  NECK: Supple. Normal thyroid.  LUNGS: Clear to auscultation bilaterally.  HEART: Regular rate and rhythm. BREASTS: Symmetric in size. No palpable masses or lymphadenopathy, skin changes, or nipple drainage. ABDOMEN: Soft, nontender, nondistended. No organomegaly. PELVIC: Normal external female genitalia. Vagina is pink and rugated.  Normal discharge. Normal appearing cervix. Uterus is normal in size. No adnexal mass or tenderness. Cystocele and rectocele noted with stool in rectal vault. No uterine prolapse appreciated. EXTREMITIES: No cyanosis, clubbing, or edema, 2+ distal pulses.       Assessment & Plan:  40 yo [phone] with chronic abdominal pain, constipation, cystocele and rectocele  - Because of the unclear etiology of her pelvic pain- not necessarily related to endometriosis- patient referred to gastroenterology - It was explained to the patient that if no other  etiology of her pain was discovered that a hysterectomy could be entertained with anterior and posterior repair. Patient also understands that if endometriosis is not causing her pelvic pain, the hysterectomy will not resolve her chronic pain issue. - patient was also offered Depo-Lupron to manage her endometriosis but the patient declined. - a urine culture was ordered to r/o uti as the etiology of her dysuria

## 2011-02-03 NOTE — Progress Notes (Signed)
Pt refused GI ref. States does not have the money for visit and finds unnecessary. Pt filled out Lup[place] application but not sure if wants that injection at this time. Pt wants to know if there is any kind of assistance besides medicaid and assistance through the hospital. Advised the pt the GI ref would be $120 but pt can not afford. Pt is to return after GI visit.

## 2011-02-03 NOTE — Progress Notes (Signed)
C/o burning pain in lower back intermittently, and when it occurs states it makes her feel flushed and swell in her head. Used to see Dr. Mia Creek - has done several surgeries.

## 2011-02-03 NOTE — Patient Instructions (Signed)
Constipation in Adults Constipation is having fewer than 2 bowel movements per week. Usually, the stools are hard. As we grow older, constipation is more common. If you try to fix constipation with laxatives, the problem may get worse. This is because laxatives taken over a long period of time make the colon muscles weaker. A low-fiber diet, not taking in enough fluids, and taking some medicines may make these problems worse. MEDICATIONS THAT MAY CAUSE CONSTIPATION  Water pills (diuretics).  Calcium channel blockers (used to control blood pressure and for the heart).   Certain pain medicines (narcotics).   Anticholinergics.  Anti-inflammatory agents.   Antacids that contain aluminum.   DISEASES THAT CONTRIBUTE TO CONSTIPATION  Diabetes.  Parkinson's disease.   Dementia.   Stroke.  Depression.   Illnesses that cause problems with salt and water metabolism.   HOME CARE INSTRUCTIONS  Constipation is usually best cared for without medicines. Increasing dietary fiber and eating more fruits and vegetables is the best way to manage constipation.   Slowly increase fiber intake to 25 to 38 grams per day. Whole grains, fruits, vegetables, and legumes are good sources of fiber. A dietitian can further help you incorporate high-fiber foods into your diet.   Drink enough water and fluids to keep your urine clear or pale yellow.   A fiber supplement may be added to your diet if you cannot get enough fiber from foods.   Increasing your activities also helps improve regularity.   Suppositories, as suggested by your caregiver, will also help. If you are using antacids, such as aluminum or calcium containing products, it will be helpful to switch to products containing magnesium if your caregiver says it is okay.   If you have been given a liquid injection (enema) today, this is only a temporary measure. It should not be relied on for treatment of longstanding (chronic) constipation.    Stronger measures, such as magnesium sulfate, should be avoided if possible. This may cause uncontrollable diarrhea. Using magnesium sulfate may not allow you time to make it to the bathroom.  SEEK IMMEDIATE MEDICAL CARE IF:  There is bright red blood in the stool.   The constipation stays for more than 4 days.   There is belly (abdominal) or rectal pain.   You do not seem to be getting better.   You have any questions or concerns.  MAKE SURE YOU:  Understand these instructions.   Will watch your condition.   Will get help right away if you are not doing well or get worse.  Document Released: 01/15/2004 Document Re-Released: 07/13/2009 [facility] Patient Information 2011 [place], [place].Pelvic Pain Pelvic pain is pain below the belly button and located between your hips. Acute pain may last a few hours or days. Chronic pelvic pain may last weeks and months. The cause these different types of pelvic may be different. The pain may be dull or sharp, mild or severe and can interfere with your daily activities. Write down and tell your caregiver:   Exactly where the pain is located.   If it comes and goes or is there all the time.   When it happens (with sex, urination, bowel movement, etc.)   If the pain is related to your menstrual period or stress.  Your caregiver will take a full history and do a complete physical exam and Pap test. CAUSES  Painful menstrual periods (dysmenorrhea).   Normal ovulation (Mittelschmertz) that occurs in the middle of the menstrual cycle every month.  The pelvic organs get engorged with blood just before the menstrual period (pelvic congestive syndrome).   Scar tissue from an infection or past surgery (pelvic adhesions).   Cancer of the female pelvic organs. When there is pain with cancer, it has been there for a long time.   The lining of the uterus (endometrium) abnormally grows in places like the pelvis and on the pelvic organs  (endometriosis).   A form of endometriosis with the lining of the uterus present inside of the muscle tissue of the uterus (adenomyosis).   Fibroid tumor (noncancerous) in the uterus.   Bladder problems such as infection, bladder spasms of the muscle tissue of the bladder.   Intestinal problems (irritable bowel syndrome, colitis, an ulcer or gastrointestinal infection).   Polyps of the cervix or uterus.   Pregnancy in the tube (ectopic pregnancy).   The opening of the cervix is too small for the menstrual blood to flow through it (cervical stenosis).   Physical or sexual abuse (past or present).   Musculo-skeletal problems from poor posture, problems with the vertebrae of the lower back or the uterine pelvic muscles falling (prolapse).   Psychological problems such as depression or stress.   IUD (intrauterine device) in the uterus.  DIAGNOSIS Tests to make a diagnosis depends on the type, location, severity and what causes the pain to occur. Tests that may be needed include:  Blood tests.  Urine tests   Ultrasound.   X-rays.  CT Scan.   MRI.  Laparoscopy.   Major surgery.   TREATMENT Treatment will depend on the cause of the pain, which includes:  Prescription or over-the-counter pain medication.  Antibiotics.   Birth control pills.   Hormone treatment.  Nerve blocking injections.   Physical therapy.   Antidepressants.  Counseling with a psychiatrist or psychologist.   Minor or major surgery.   HOME CARE INSTRUCTIONS  Only take over-the-counter or prescription medicines for pain, discomfort or fever as directed by your caregiver.   Follow your caregiver's advice to treat your pain.   Rest.   Avoid sexual intercourse if it causes the pain.   Apply warm or cold compresses (which ever works best) to the pain area.   Do relaxation exercises such as yoga or meditation.   Try acupuncture.   Avoid stressful situations.   Try group therapy.   If  the pain is because of a stomach/intestinal upset, drink clear liquids, eat a bland light food diet until the symptoms go away.  SEEK MEDICAL CARE IF:  You need stronger prescription pain medication.   You develop pain with sexual intercourse.   You have pain with urination.   You develop a temperature of 100.4 with the pain.   You are still in pain after 4 hours of taking prescription medication for the pain.   You need depression medication.   Your IUD is causing pain and you want it removed.  SEEK IMMEDIATE MEDICAL CARE IF YOU DEVELOP:  Very severe pain or tenderness.   Fainting, chills, severe weakness or dehydration.   Heavy vaginal bleeding or passing solid tissue.   You develop a temperature of 100.4 with the pain.   You have blood in the urine.   You are being physically or sexually abused.   You have uncontrolled vomiting and diarrhea.   You are depressed and afraid of harming yourself or someone else.  Document Released: 05/26/2004 Document Re-Released: 02/19/2008 [facility] Patient Information 2011 [place], [place].

## 2011-02-04 LAB — POCT URINALYSIS DIP (DEVICE)
Glucose, UA: NEGATIVE mg/dL
Glucose, UA: NEGATIVE
Hgb urine dipstick: NEGATIVE
Nitrite: NEGATIVE
Nitrite: POSITIVE — AB
Protein, ur: NEGATIVE mg/dL
Protein, ur: NEGATIVE
Urobilinogen, UA: 0.2 mg/dL (ref 0.0–1.0)
Urobilinogen, UA: 0.2
pH: 6.5

## 2011-02-04 LAB — CBC
MCHC: 34.5
Platelets: 257
RBC: 4.3
RDW: 13.5

## 2011-02-04 LAB — DIFFERENTIAL
Basophils Absolute: 0
Basophils Relative: 0
Eosinophils Absolute: 0.1
Neutro Abs: 6.4
Neutrophils Relative %: 69

## 2011-02-04 LAB — RPR: RPR Ser Ql: NONREACTIVE

## 2011-02-04 LAB — I-STAT 8, (EC8 V) (CONVERTED LAB)
Glucose, Bld: 96
Potassium: 4.5
TCO2: 26
pCO2, Ven: 34.8 — ABNORMAL LOW
pH, Ven: 7.461 — ABNORMAL HIGH

## 2011-02-04 LAB — WET PREP, GENITAL
Clue Cells Wet Prep HPF POC: NONE SEEN
Trich, Wet Prep: NONE SEEN
Yeast Wet Prep HPF POC: NONE SEEN

## 2011-02-04 LAB — POCT I-STAT CREATININE: Operator id: 294501

## 2011-06-04 ENCOUNTER — Emergency Department: Payer: Self-pay | Admitting: Emergency Medicine

## 2011-06-04 LAB — COMPREHENSIVE METABOLIC PANEL
BUN: 13 mg/dL (ref 7–18)
Chloride: 104 mmol/L (ref 98–107)
EGFR (African American): >60
EGFR (Non-African Amer.): >60
Glucose: 95 mg/dL (ref 65–99)
SGOT(AST): 14 U/L — ABNORMAL LOW (ref 15–37)
SGPT (ALT): 22 U/L
Total Protein: 7.9 g/dL (ref 6.4–8.2)

## 2011-06-04 LAB — CBC
MCH: 31.7 pg (ref 26.0–34.0)
MCV: 93 fL (ref 80–100)
Platelet: 222 10*3/uL (ref 150–440)
RDW: 13.1 % (ref 11.5–14.5)

## 2011-06-04 LAB — URINALYSIS, COMPLETE
Bacteria: NONE SEEN
Bilirubin,UR: NEGATIVE
Blood: NEGATIVE
Glucose,UR: NEGATIVE mg/dL (ref 0–75)
Nitrite: NEGATIVE
Specific Gravity: 1.015 (ref 1.003–1.030)
Squamous Epithelial: <1
WBC UR: 2 /HPF (ref 0–5)

## 2011-06-04 LAB — PREGNANCY, URINE: Pregnancy Test, Urine: NEGATIVE m[IU]/mL

## 2011-06-28 ENCOUNTER — Emergency Department: Payer: Self-pay | Admitting: Emergency Medicine

## 2011-07-25 ENCOUNTER — Other Ambulatory Visit: Payer: Self-pay | Admitting: Obstetrics and Gynecology

## 2011-07-25 DIAGNOSIS — R922 Inconclusive mammogram: Secondary | ICD-10-CM

## 2011-07-25 DIAGNOSIS — R9230 Dense breasts, unspecified: Secondary | ICD-10-CM

## 2011-07-26 ENCOUNTER — Ambulatory Visit: Payer: Self-pay

## 2011-08-12 ENCOUNTER — Emergency Department: Payer: Self-pay | Admitting: Emergency Medicine

## 2011-08-12 LAB — BASIC METABOLIC PANEL
BUN: 8 mg/dL (ref 7–18)
Calcium, Total: 8.4 mg/dL — ABNORMAL LOW (ref 8.5–10.1)
Chloride: 109 mmol/L — ABNORMAL HIGH (ref 98–107)
Creatinine: 0.76 mg/dL (ref 0.60–1.30)
EGFR (African American): >60
Glucose: 83 mg/dL (ref 65–99)
Potassium: 3.9 mmol/L (ref 3.5–5.1)
Sodium: 143 mmol/L (ref 136–145)

## 2011-08-12 LAB — CBC
HGB: 12 g/dL (ref 12.0–16.0)
MCHC: 33.6 g/dL (ref 32.0–36.0)
MCV: 93 fL (ref 80–100)
RBC: 3.84 10*6/uL (ref 3.80–5.20)
RDW: 13.2 % (ref 11.5–14.5)
WBC: 8.4 10*3/uL (ref 3.6–11.0)

## 2011-08-12 LAB — URINALYSIS, COMPLETE
Bacteria: NONE SEEN
Blood: NEGATIVE
Leukocyte Esterase: NEGATIVE
Nitrite: NEGATIVE
Ph: 7 (ref 4.5–8.0)
Squamous Epithelial: 3
WBC UR: 1 /HPF (ref 0–5)

## 2011-08-12 LAB — TROPONIN I: Troponin-I: <0.02 ng/mL

## 2011-08-12 LAB — CK TOTAL AND CKMB (NOT AT ARMC): CK, Total: 159 U/L (ref 21–215)

## 2011-09-21 ENCOUNTER — Telehealth: Payer: Self-pay

## 2011-09-21 NOTE — Telephone Encounter (Signed)
Pt called and stated that she has several questions about what's going on with her and she does not have insurance "can a nurse please give me a call back."

## 2011-09-21 NOTE — Telephone Encounter (Signed)
Called pt and left message that we are returning her call to please return our call to the clinics.

## 2011-09-21 NOTE — Telephone Encounter (Signed)
Late entry 1130am- Pt returned call and informed me that she is having severe pain where sometimes it is difficult for her to walk.  She states she has endometriosis and that she has been to the ER several times in [place] concerning about the problems that she is having.  She also states that the last time she was here @ the clinics they were talking about a hysterectomy and they referred her to a GI doctor in which she did not go to the appt due to her stating the she does not have insurance.  Pt stated that she spoke with someone at the front desk here @ the clinics and they said that there was not an appt available until the end of June being of July.  I offered pt the financial assistance paperwork and pt stated that her husband would not let her fill out due to the info that it requests (tax forms, etc.)  Pt stated a concern of having to continue to go to the ER and it costing.  I advised pt that she could make an appt here and she would need to bring in $20 to the office visit to be seen and that the $20 does not cover the visit but it is like a good faith payment and the rest of visit billing she can make payments to [facility] billing.  Per Mayra Neer, we got an earlier appt for June 7 @ 1015am.  Pt stated that she would work the appt out due to plans she has made.  I informed pt that that is earliest appt available.  I advised pt to come to her appt and to fill out her financial assistance paperwork just in cause it possibly may lead to surgery she would have some coverage. Pt stated understanding and had no further questions.

## 2011-10-07 ENCOUNTER — Ambulatory Visit: Payer: Self-pay | Admitting: Obstetrics and Gynecology

## 2011-11-01 ENCOUNTER — Emergency Department (HOSPITAL_COMMUNITY)
Admission: EM | Admit: 2011-11-01 | Discharge: 2011-11-01 | Disposition: A | Discharge status: Home / Self Care | Payer: Self-pay | Attending: Emergency Medicine | Admitting: Emergency Medicine

## 2011-11-01 ENCOUNTER — Encounter (HOSPITAL_COMMUNITY): Payer: Self-pay | Admitting: *Deleted

## 2011-11-01 DIAGNOSIS — F17200 Nicotine dependence, unspecified, uncomplicated: Secondary | ICD-10-CM | POA: Insufficient documentation

## 2011-11-01 DIAGNOSIS — M549 Dorsalgia, unspecified: Secondary | ICD-10-CM | POA: Insufficient documentation

## 2011-11-01 LAB — URINALYSIS, ROUTINE W REFLEX MICROSCOPIC
Bilirubin Urine: NEGATIVE
Glucose, UA: NEGATIVE mg/dL
Leukocytes, UA: NEGATIVE
Nitrite: NEGATIVE
Specific Gravity, Urine: 1.013 (ref 1.005–1.030)
pH: 6 (ref 5.0–8.0)

## 2011-11-01 MED ORDER — TRAMADOL HCL 50 MG PO TABS: 50.0000 mg | ORAL_TABLET | Freq: Four times a day (QID) | ORAL | Status: AC | PRN

## 2011-11-01 NOTE — ED Notes (Signed)
Pt states she fell in her [place] a month ago and then she fell in [employer]. Pt states she was seen [place]. Pt states she has worsen back pain. Pt states she thinks the pain in back is making her nausea. No emsis.

## 2011-11-01 NOTE — ED Provider Notes (Signed)
History     CSN: [id number]  Arrival date & time 11/01/11  1423   First MD Initiated Contact with Patient 11/01/11 1717      Chief Complaint  Patient presents with  . Fall    (Consider location/radiation/quality/duration/timing/severity/associated sxs/prior treatment) Patient is a 41 y.o. female presenting with fall. The history is provided by the patient.  Fall The accident occurred more than 1 week ago. The fall occurred while walking. She landed on concrete. The pain is at a severity of 10/10. Associated symptoms include nausea. Pertinent negatives include no fever, no numbness and no hematuria.   41 y/o female INAD c/o right thoracic  back pain x 1 month s/p slip and fall on concrete. Pt was seen at [facility] ED and XR showed arthritis, no fracture,as per Pt. She is concerned becuase the pain is not improving. Pt states that when the affected area is pressed she releases flatus. Pt reports nausea x1 month and urinary frequency. Pt also reports scabs to scalp x1 month that heal when she washes her hair. Denies red flags all for back pain  Past Medical History  Diagnosis Date  . Allergy   . Bladder disorder   . UTI (lower urinary tract infection)   . Bladder infection   . Eczema     Past Surgical History  Procedure Date  . Bladder surgery   . Laparoscopy     Family History  Problem Relation Age of Onset  . Heart disease Father   . GER disease Father   . Hypertension Father   . GER disease Mother   . GER disease Sister   . GER disease Brother     History  Substance Use Topics  . Smoking status: Current Everyday Smoker -- 0.5 packs/day  . Smokeless tobacco: Not on file  . Alcohol Use: 2.4 oz/week    2 Cans of beer, 2 Glasses of wine per week    OB History    Grav Para Term Preterm Abortions TAB SAB Ect Mult Living   4 3 3  1 1  0   2      Review of Systems  Constitutional: Negative for fever and unexpected weight change.  Respiratory: Negative for shortness  of breath.   Gastrointestinal: Positive for nausea.  Genitourinary: Positive for frequency. Negative for hematuria and flank pain.  Musculoskeletal: Positive for back pain. Negative for gait problem.  Skin:       Scabs to head  Neurological: Negative for numbness.  All other systems reviewed and are negative.    Allergies  Codeine and Sulfa antibiotics  Home Medications   Current Outpatient Rx  Name Route Sig Dispense Refill  . IBUPROFEN 200 MG PO TABS Oral Take 600 mg by mouth every 6 (six) hours as needed. pain    . ONE-DAILY MULTI VITAMINS PO TABS Oral Take 1 tablet by mouth daily.     Marland Kitchen OVER THE COUNTER MEDICATION Oral Take 1 tablet by mouth daily as needed. Acid reducer      BP 122/82  Pulse 83  Temp 98.3 F (36.8 C) (Oral)  Resp 16  SpO2 97%  LMP 10/20/2011  Physical Exam  Nursing note and vitals reviewed. Constitutional: She is oriented to person, place, and time. She appears well-developed and well-nourished. No distress.  HENT:  Head: Normocephalic and atraumatic.  Mouth/Throat: Oropharynx is clear and moist.  Eyes: Conjunctivae and EOM are normal. Pupils are equal, round, and reactive to light.  Neck: Normal range of  motion. Neck supple.  Cardiovascular: Normal rate and regular rhythm.   Pulmonary/Chest: Effort normal.  Abdominal: Soft. Bowel sounds are normal. She exhibits no distension and no mass. There is no tenderness. There is no rebound and no guarding.  Musculoskeletal: Normal range of motion.       Diffuse tenderness to lower thoracic paraspinal muscles.   Neurological: She is alert and oriented to person, place, and time. No cranial nerve deficit.       CN 3-12 intact  Skin:       Scattered scabbing to scalp. No signs of deep tissue infection, redness, erythema or warmth  Psychiatric: She has a normal mood and affect. Her behavior is normal.    ED Course  Procedures (including critical care time)   Labs Reviewed  URINALYSIS, ROUTINE W REFLEX  MICROSCOPIC  LAB REPORT - SCANNED   No results found.   1. Back pain       MDM  Chronic back pain with no associated symptoms concerning for acute process. Will control pain and give resource guide for outpatient care.  Discussed case with attending Dr. Juleen China who agrees with plan. Pt verbalized understanding and agrees with care plan. Outpatient follow-up and return precautions given.           Wynetta Emery, PA-C 11/04/11 1034

## 2011-11-01 NOTE — Progress Notes (Signed)
Pt confirms she is a [place] pt from [place] also close to [place] Pt seen by [employer] and offered information for local [place] pcps/services.  ED CM spoke with pt in details about her concern with seeing a pcp.  Reports she feels she is not receiving services she should be receiving. Pt states she has a hx of endometriosis dx by a provider in [place] who changed to [facility] prior to her being able to obtain OB GYN surgery.  [facility] "[facility]ro [facility] for three months only" Pt reports she does not work but her husband does She reports she has been seen [facility] MD, "Dr Ursula Beath" who had her to sit with a financial counselor who gave her a package of financial papers but she did not complete and follow up because her "husband does not want to give his financial information" "he is just like that" Cm encouraged pt to return to [facility] to speak with staff about her OB GYN concerns although she also voiced concern of back pain during this ED visit and pt continues to state she "believe this is all because of my endometriosis" Informed pt that [facility] does see self pay patients.  Provided pt with a list of self pay pcps for [place] ([facility], [facility], [facility] clinic in [place], [place] and [facility]. Pt reports she is familiar with [facility] and has heard of [facility].  Pt inquired if there were any facility or other service that could assist her with medicaid application Cm informed pt that the DSS is the primary location to go to get medicaid application completed.  Pt does not have access to Internet CM informed pt she could also access her local library to obtain Internet access at hourly intervals.  Pt voiced understanding and appreciation of written resources provided and questions answered

## 2011-11-09 NOTE — ED Provider Notes (Signed)
Medical screening examination/treatment/procedure(s) were performed by non-physician practitioner and as supervising physician I was immediately available for consultation/collaboration.  China Deitrick, MD 11/09/11 0926 

## 2012-01-12 ENCOUNTER — Telehealth (HOSPITAL_COMMUNITY): Payer: Self-pay | Admitting: *Deleted

## 2012-01-12 NOTE — Telephone Encounter (Signed)
Telephoned patient at mobile # and left message to return call to [facility] 

## 2012-01-13 ENCOUNTER — Encounter (HOSPITAL_COMMUNITY): Payer: Self-pay | Admitting: Family

## 2012-01-13 ENCOUNTER — Inpatient Hospital Stay (HOSPITAL_COMMUNITY)
Admission: AD | Admit: 2012-01-13 | Discharge: 2012-01-13 | Disposition: A | Discharge status: Home / Self Care | Payer: Medicaid Other | Source: Ambulatory Visit | Attending: Obstetrics & Gynecology | Admitting: Obstetrics & Gynecology

## 2012-01-13 ENCOUNTER — Inpatient Hospital Stay (HOSPITAL_COMMUNITY): Payer: Medicaid Other

## 2012-01-13 DIAGNOSIS — K5909 Other constipation: Secondary | ICD-10-CM

## 2012-01-13 DIAGNOSIS — R102 Pelvic and perineal pain: Secondary | ICD-10-CM

## 2012-01-13 DIAGNOSIS — N938 Other specified abnormal uterine and vaginal bleeding: Secondary | ICD-10-CM | POA: Insufficient documentation

## 2012-01-13 DIAGNOSIS — N939 Abnormal uterine and vaginal bleeding, unspecified: Secondary | ICD-10-CM

## 2012-01-13 DIAGNOSIS — K5900 Constipation, unspecified: Secondary | ICD-10-CM | POA: Insufficient documentation

## 2012-01-13 DIAGNOSIS — N949 Unspecified condition associated with female genital organs and menstrual cycle: Secondary | ICD-10-CM | POA: Insufficient documentation

## 2012-01-13 DIAGNOSIS — K5904 Chronic idiopathic constipation: Secondary | ICD-10-CM

## 2012-01-13 DIAGNOSIS — R109 Unspecified abdominal pain: Secondary | ICD-10-CM | POA: Insufficient documentation

## 2012-01-13 LAB — CBC
MCV: 93.7 fL (ref 78.0–100.0)
Platelets: 223 10*3/uL (ref 150–400)
RDW: 13.4 % (ref 11.5–15.5)
WBC: 6.5 10*3/uL (ref 4.0–10.5)

## 2012-01-13 LAB — URINALYSIS, ROUTINE W REFLEX MICROSCOPIC
Bilirubin Urine: NEGATIVE
Nitrite: NEGATIVE
Specific Gravity, Urine: 1.01 (ref 1.005–1.030)
Urobilinogen, UA: 0.2 mg/dL (ref 0.0–1.0)

## 2012-01-13 LAB — URINE MICROSCOPIC-ADD ON

## 2012-01-13 LAB — WET PREP, GENITAL: Clue Cells Wet Prep HPF POC: NONE SEEN

## 2012-01-13 MED ORDER — KETOROLAC TROMETHAMINE 60 MG/2ML IM SOLN
60.0000 mg | Freq: Once | INTRAMUSCULAR | Status: AC
  Administered 2012-01-13: 60 mg via INTRAMUSCULAR
  Filled 2012-01-13: qty 2

## 2012-01-13 MED ORDER — DICLOFENAC SODIUM 75 MG PO TBEC: 75.0000 mg | DELAYED_RELEASE_TABLET | Freq: Two times a day (BID) | ORAL | Status: AC

## 2012-01-13 NOTE — MAU Provider Note (Signed)
I was present for the exam and agree with above.  [place], [place] 01/13/2012 8:51 PM

## 2012-01-13 NOTE — MAU Provider Note (Signed)
History     CSN: [id number]  Arrival date and time: 01/13/12 0954   None     No chief complaint on file.  HPI Patient presents to the MAU c/o lower abdominal pain and abnormal vaginal bleeding that has been present for a few days.  States she has had an abnormal period over the past couple of days.  Has had intermittent periods of flow and spotting.  Has noticed some black particles in the blood.  Unsure if this is clotting.  Has had suprapubic pain that has been present for over a month that has increased in severity over the past few days.  The pain is sharp, and radiates to her "vaginal lips".  Patient denies any recent sexual intercourse, hx of dyspareunia, hematuria, dysuria.  Does note a foul smelling urine.  Denies recent fever or infection but endorses chills 2 days ago that has resolved.  Has a history of uterine prolapse for which she was given a pessary that she has not used.  Denies symptoms of menopause.  Patient also has a history of endometriosis.   OB History    Grav Para Term Preterm Abortions TAB SAB Ect Mult Living   4 3 3  1 1  0   2      Past Medical History  Diagnosis Date  . Allergy   . Bladder disorder   . UTI (lower urinary tract infection)   . Bladder infection   . Eczema     Past Surgical History  Procedure Date  . Bladder surgery   . Laparoscopy   . Tubal ligation     Family History  Problem Relation Age of Onset  . Heart disease Father   . GER disease Father   . Hypertension Father   . GER disease Mother   . GER disease Sister   . GER disease Brother     History  Substance Use Topics  . Smoking status: Current Every Day Smoker -- 0.5 packs/day  . Smokeless tobacco: Not on file  . Alcohol Use: 2.4 oz/week    2 Glasses of wine, 2 Cans of beer per week    Allergies:  Allergies  Allergen Reactions  . Codeine Anaphylaxis    Shortness of breath  . Sulfa Antibiotics Rash    Prescriptions prior to admission  Medication Sig Dispense  Refill  . ibuprofen (ADVIL,MOTRIN) 200 MG tablet Take 600 mg by mouth every 6 (six) hours as needed. pain      . Multiple Vitamin (MULTIVITAMIN) tablet Take 1 tablet by mouth daily.         Review of Systems  Constitutional: Positive for chills. Negative for fever, weight loss, malaise/fatigue and diaphoresis.  Respiratory: Negative.   Cardiovascular: Negative.   Gastrointestinal: Positive for nausea, abdominal pain and constipation. Negative for heartburn, vomiting, diarrhea, blood in stool and melena.  Genitourinary: Negative for dysuria, urgency, frequency, hematuria and flank pain.  Neurological: Negative for weakness.   Physical Exam   Blood pressure 124/82, pulse 84, temperature 97 F (36.1 C), temperature source Oral, resp. rate 16, height 5' 2.5" (1.588 m), weight 90.901 kg (200 lb 6.4 oz), last menstrual period 01/11/2012.  Physical Exam  Constitutional: She is oriented to person, place, and time. She appears well-developed and well-nourished. No distress.  HENT:  Head: Normocephalic and atraumatic.  Eyes: Conjunctivae normal and EOM are normal.  Neck: Normal range of motion.  Cardiovascular: Normal rate, regular rhythm and normal heart sounds.  Exam reveals no gallop  and no friction rub.   No murmur heard. Respiratory: Effort normal and breath sounds normal. No respiratory distress. She has no wheezes. She has no rales. She exhibits no tenderness.  GI: Soft. She exhibits no distension and no mass. There is no hepatosplenomegaly. There is tenderness in the suprapubic area. There is no rebound and no guarding.  Genitourinary: Uterus normal. There is no rash or tenderness on the right labia. There is no rash or tenderness on the left labia. Cervix exhibits motion tenderness. Cervix exhibits no discharge. Right adnexum displays tenderness. Right adnexum displays no mass. Left adnexum displays tenderness. Left adnexum displays no mass. There is tenderness and bleeding around the  vagina. No vaginal discharge found.          Neurological: She is alert and oriented to person, place, and time.  Skin: Skin is warm and dry. No rash noted. She is not diaphoretic. No erythema. No pallor.    MAU Course  Procedures --CBC -- non-contrib. --Wet Prep -- rare WBCs; otherwise WNL --Vaginal G/C -- pending --Urine Pregnancy -- neg --Urinalysis -- trace blood --Transvaginal US -- normal presecretory ultrasound      Assessment and Plan  (1) Adnexal Tenderness/Vaginal Bleeding     -- Tenderness most likely secondary to endometriosis and constipation.  TVUS was normal.     -- Voltaren for pain   (2) Constipation -- OTC Miralax  Patient needs to find a PCP provider for non OB/GYN medical management.  F/U appointment in 2 wks here in GYN clinic.  Educated about some local health care assistance.  Marcelline Mates 01/13/2012, 11:15 AM

## 2012-01-13 NOTE — MAU Note (Signed)
Pt states about a year ago she was seen at clinic here and told she her uterine "had fallen" and was given a rubber ring to insert. She never used it.

## 2012-01-13 NOTE — MAU Note (Signed)
Pt states on 9/9 started having lower abdominal/vaginal swelling and tenderness. Started period on 9/11.

## 2012-01-14 LAB — GC/CHLAMYDIA PROBE AMP, GENITAL
Chlamydia, DNA Probe: NEGATIVE
GC Probe Amp, Genital: NEGATIVE

## 2012-01-18 ENCOUNTER — Telehealth (HOSPITAL_COMMUNITY): Payer: Self-pay | Admitting: *Deleted

## 2012-01-18 NOTE — Telephone Encounter (Signed)
Telephoned patient at home # and left message to return call to [facility] 

## 2012-01-24 ENCOUNTER — Ambulatory Visit (HOSPITAL_COMMUNITY): Payer: Self-pay

## 2012-01-25 ENCOUNTER — Telehealth (HOSPITAL_COMMUNITY): Payer: Self-pay | Admitting: *Deleted

## 2012-01-25 NOTE — Telephone Encounter (Signed)
Patient called and stated had car trouble and was unable to make appointment. Patient was rescheduled.

## 2012-01-26 ENCOUNTER — Ambulatory Visit: Payer: Self-pay | Admitting: Obstetrics and Gynecology

## 2012-01-30 ENCOUNTER — Ambulatory Visit: Payer: Self-pay | Admitting: Obstetrics & Gynecology

## 2012-02-02 ENCOUNTER — Encounter (HOSPITAL_COMMUNITY): Payer: Self-pay | Admitting: *Deleted

## 2012-02-14 ENCOUNTER — Inpatient Hospital Stay (HOSPITAL_COMMUNITY): Admission: RE | Admit: 2012-02-14 | Discharge: 2012-02-14 | Payer: Self-pay | Source: Ambulatory Visit

## 2012-02-14 NOTE — Progress Notes (Signed)
Patient ID: [name], female   DOB: February 13, 1971, 41 y.o.   MRN: [id number] Opened chart in error.

## 2012-02-20 ENCOUNTER — Ambulatory Visit: Payer: Self-pay | Admitting: Obstetrics & Gynecology

## 2012-03-21 DIAGNOSIS — R300 Dysuria: Secondary | ICD-10-CM | POA: Insufficient documentation

## 2012-04-05 DIAGNOSIS — R21 Rash and other nonspecific skin eruption: Secondary | ICD-10-CM | POA: Insufficient documentation

## 2012-04-05 DIAGNOSIS — M25519 Pain in unspecified shoulder: Secondary | ICD-10-CM | POA: Insufficient documentation

## 2012-04-11 ENCOUNTER — Ambulatory Visit: Payer: Self-pay | Admitting: Nurse Practitioner

## 2012-04-19 ENCOUNTER — Ambulatory Visit: Payer: Medicaid Other | Admitting: Obstetrics & Gynecology

## 2012-04-23 ENCOUNTER — Encounter: Payer: Self-pay | Admitting: Obstetrics & Gynecology

## 2012-04-23 ENCOUNTER — Ambulatory Visit (INDEPENDENT_AMBULATORY_CARE_PROVIDER_SITE_OTHER): Payer: Medicaid Other | Admitting: Obstetrics & Gynecology

## 2012-04-23 VITALS — BP 111/73 | HR 78 | Temp 97.4°F | Ht 63.0 in | Wt 200.3 lb

## 2012-04-23 DIAGNOSIS — R102 Pelvic and perineal pain: Secondary | ICD-10-CM

## 2012-04-23 DIAGNOSIS — N949 Unspecified condition associated with female genital organs and menstrual cycle: Secondary | ICD-10-CM

## 2012-04-23 NOTE — Patient Instructions (Signed)
Return to clinic for any scheduled appointments or for any gynecologic concerns as needed.   

## 2012-04-24 ENCOUNTER — Encounter: Payer: Self-pay | Admitting: Obstetrics & Gynecology

## 2012-04-24 NOTE — Progress Notes (Signed)
History:  41 y.o. [id number] here today for evaluation of pelvic pain and back for several years, also reports bloating and belching in addition to "bladder pain".  She had a laparoscopic BTL in 2006 and the operative note showed that the surgeon saw Stage II endometriosis, but no biopsies were done.  Patient has been seen several times in this clinic by different providers, and examined several times.  The conclusion was that her pain was not likely of GYN origin, as a result, TAH/BSO which the patient keeps requesting, will not help with her pain.  She wants yet another reevaluation.  She reports lower pelvic pain and sharp back pain, radiating down her legs associated with bloating and belching. Also reports having pain in her bladder, had multiple negative UAs.  The following portions of the patient's history were reviewed and updated as appropriate: allergies, current medications, past family history, past medical history, past social history, past surgical history and problem list.  Review of Systems:  Pertinent items are noted in HPI.  Objective:  Physical Exam BP 111/73  Pulse 78  Temp 97.4 F (36.3 C)  Ht 5\' 3"  (1.6 m)  Wt 200 lb 4.8 oz (90.855 kg)  BMI 35.48 kg/m2 Gen: NAD Abd: Soft, nontender and nondistended Pelvic: Normal appearing external genitalia; normal appearing vaginal mucosa and cervix.  Normal discharge.  Small uterus, no other palpable masses, mild diffuse uterine and adnexal tenderness   01/13/12  TRANSABDOMINAL AND TRANSVAGINAL ULTRASOUND OF PELVIS Clinical Data: Pelvic and vaginal pain. Dysfunctional uterine bleeding. LMP 01/11/2011 Comparison: 2008 Findings: Uterus: The uterus is anteverted and anteflexed and demonstrates a sagittal length of 7.1 cm, depth of 4.1 cm in width of 4.6 cm. A homogeneous uterine myometrium is seen.  Endometrium: Appears homogeneously echogenic with a width of 6.8 mm. No areas of focal thickening or heterogeneity are seen and this would  correlate with a postsecretory endometrial stripe and correspond with the provided LMP of 01/11/2012. Right ovary: Has a normal appearance measuring 2.6 x 1.1 x 2.4 cm.  Left ovary: Has a normal appearance measuring 1.9 x 1.6 x 1.5 cm. Other findings: No pelvic fluid or separate adnexal masses are noted. IMPRESSION:  Normal presecretory pelvic ultrasound.   Assessment & Plan:  Patient with pelvic pain and reported laparoscopic evidence of endometriosis, requesting hysterectomy and BSO.  Patient also has back pain and GI symptoms which are more severe,  She was told that her pain was likely not of GYN etiology alone, as such, a hysterectomy and BSO may not help her pain and would render her menopausal which has other consequences.   She was offered Depo Lupron trial to see if this helps, but she declines this for now.  She will go to her PCP and get referred to GI for further evaluation.

## 2012-05-25 ENCOUNTER — Other Ambulatory Visit: Payer: Self-pay | Admitting: Internal Medicine

## 2012-05-25 DIAGNOSIS — R922 Inconclusive mammogram: Secondary | ICD-10-CM

## 2012-06-08 ENCOUNTER — Ambulatory Visit
Admission: RE | Admit: 2012-06-08 | Discharge: 2012-06-08 | Disposition: A | Discharge status: Home / Self Care | Payer: Medicaid Other | Source: Ambulatory Visit | Attending: Internal Medicine | Admitting: Internal Medicine

## 2012-06-08 DIAGNOSIS — R9230 Dense breasts, unspecified: Secondary | ICD-10-CM

## 2012-06-08 DIAGNOSIS — R922 Inconclusive mammogram: Secondary | ICD-10-CM

## 2012-06-20 ENCOUNTER — Other Ambulatory Visit: Payer: Self-pay | Admitting: Gastroenterology

## 2012-06-20 DIAGNOSIS — G8929 Other chronic pain: Secondary | ICD-10-CM

## 2012-06-25 ENCOUNTER — Other Ambulatory Visit: Payer: Medicaid Other

## 2012-06-27 ENCOUNTER — Other Ambulatory Visit: Payer: Medicaid Other

## 2013-07-02 ENCOUNTER — Emergency Department: Payer: Self-pay | Admitting: Emergency Medicine

## 2013-09-13 IMAGING — CR DG SHOULDER 3+V*R*
1 series · 3 of 3 positions shown · non-contrast
Comparison: none

REASON FOR EXAM: shoulder pain
COMMENTS:

PROCEDURE:     DXR - DXR SHOULDER RIGHT COMPLETE  - April 11, 2012 [DATE]
RESULT:     There is no evidence of fracture, dislocation, or malalignment.

[Series 1: internal rotate · 0.17mm/px · 3 of 3 slices shown]
[im 1/3]
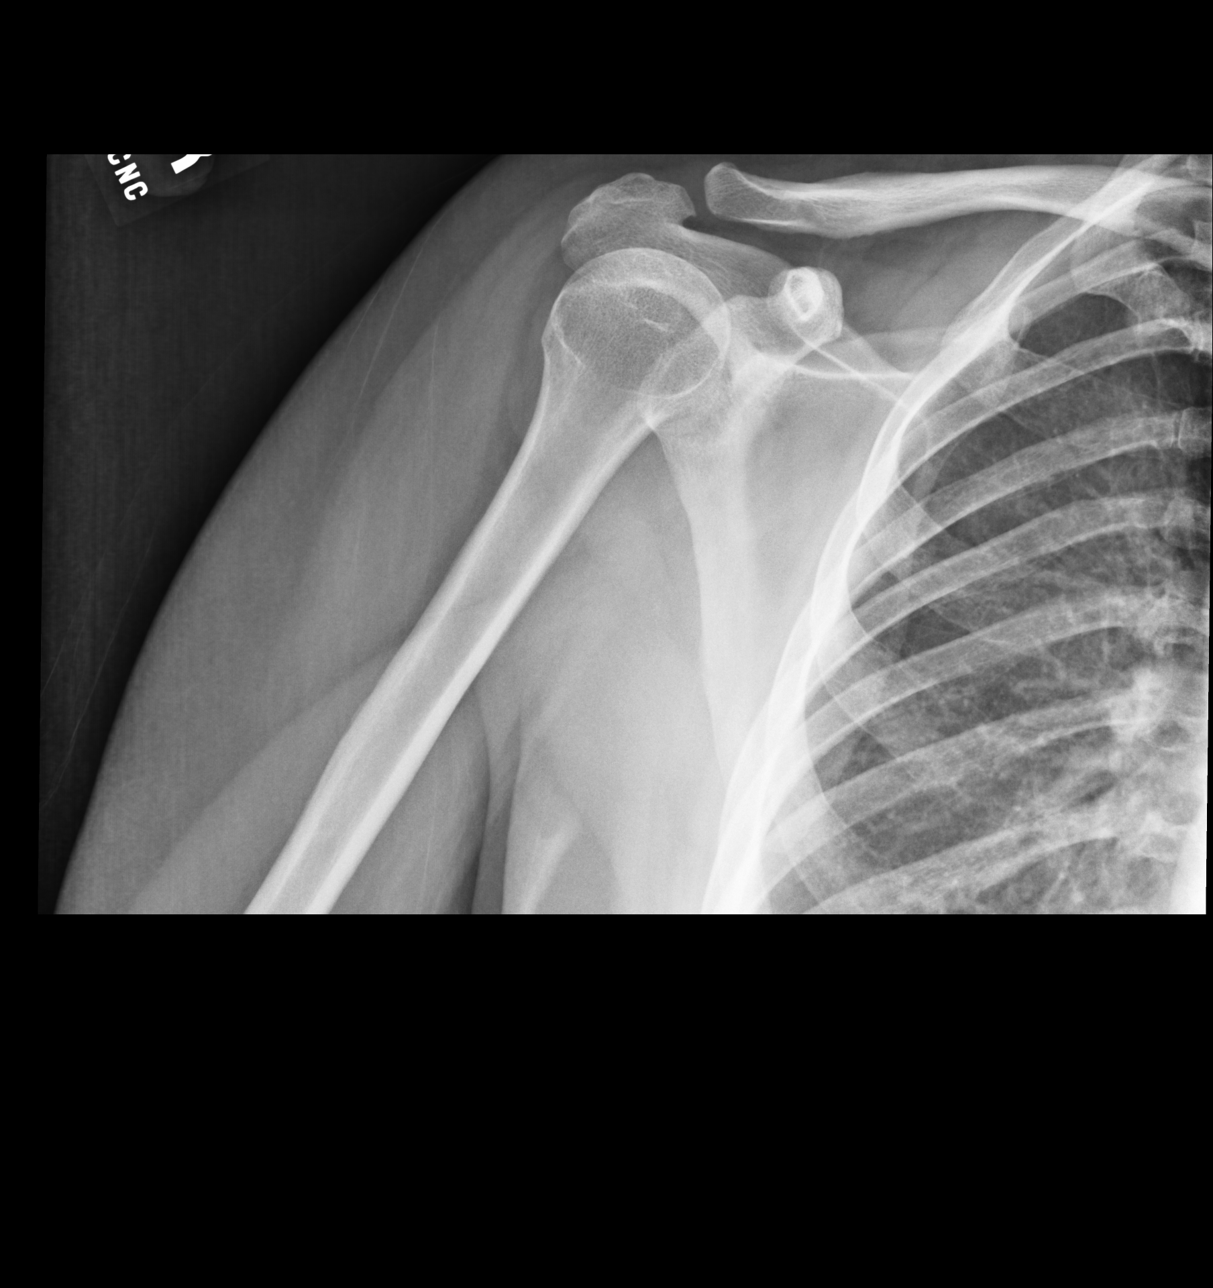
[im 2/3]
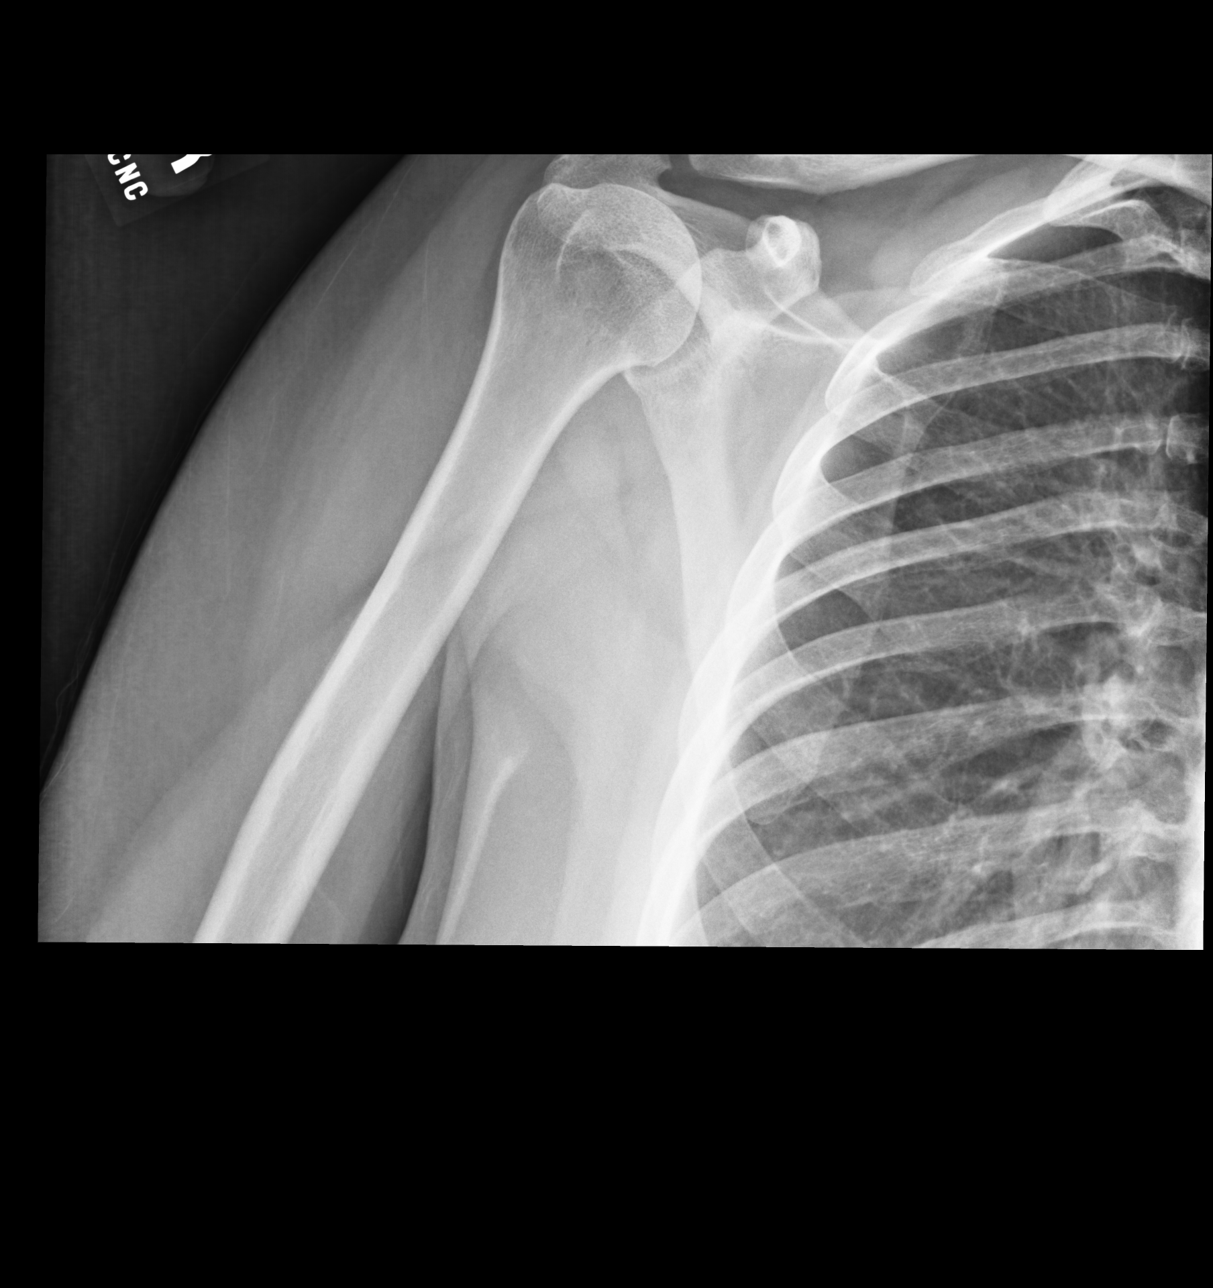
[im 3/3]
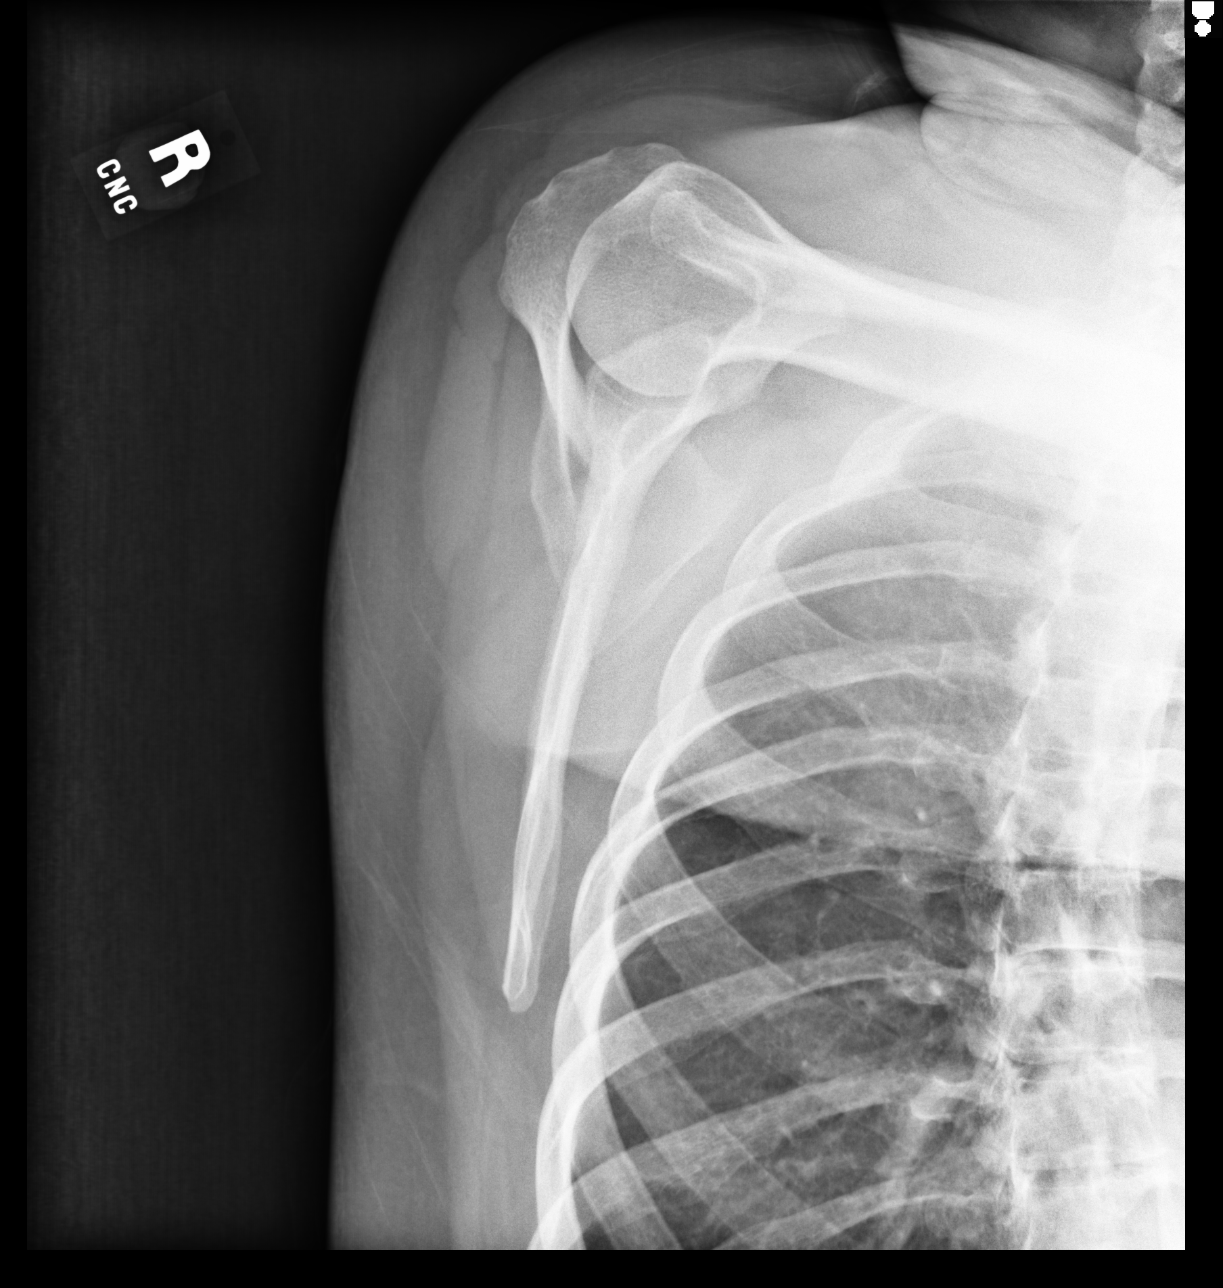

[3 of 3 positions shown; findings below may reference images not displayed]

IMPRESSION: 1. No evidence of acute abnormalities.
2. If there are persistent complaints of pain or persistent clinical
concern, a repeat evaluation in 7-10 days is recommended if clinically
warranted.

## 2014-03-03 ENCOUNTER — Encounter: Payer: Self-pay | Admitting: Obstetrics & Gynecology

## 2015-08-04 ENCOUNTER — Emergency Department
Admission: EM | Admit: 2015-08-04 | Discharge: 2015-08-05 | Discharge status: Against Medical Advice | Payer: Self-pay | Attending: Emergency Medicine | Admitting: Emergency Medicine

## 2015-08-04 ENCOUNTER — Encounter: Payer: Self-pay | Admitting: Emergency Medicine

## 2015-08-04 DIAGNOSIS — F17210 Nicotine dependence, cigarettes, uncomplicated: Secondary | ICD-10-CM | POA: Insufficient documentation

## 2015-08-04 DIAGNOSIS — Z87440 Personal history of urinary (tract) infections: Secondary | ICD-10-CM | POA: Insufficient documentation

## 2015-08-04 DIAGNOSIS — R102 Pelvic and perineal pain: Secondary | ICD-10-CM | POA: Insufficient documentation

## 2015-08-04 LAB — CBC
HCT: 37.4 % (ref 35.0–47.0)
Hemoglobin: 13 g/dL (ref 12.0–16.0)
MCH: 32.7 pg (ref 26.0–34.0)
MCHC: 34.8 g/dL (ref 32.0–36.0)
MCV: 94.1 fL (ref 80.0–100.0)
PLATELETS: 206 10*3/uL (ref 150–440)
RBC: 3.97 MIL/uL (ref 3.80–5.20)
RDW: 14.7 % — AB (ref 11.5–14.5)
WBC: 8 10*3/uL (ref 3.6–11.0)

## 2015-08-04 LAB — URINALYSIS COMPLETE WITH MICROSCOPIC (ARMC ONLY)
BILIRUBIN URINE: NEGATIVE
Bacteria, UA: NONE SEEN
Glucose, UA: NEGATIVE mg/dL
Hgb urine dipstick: NEGATIVE
KETONES UR: NEGATIVE mg/dL
LEUKOCYTES UA: NEGATIVE
Nitrite: NEGATIVE
Protein, ur: NEGATIVE mg/dL
RBC / HPF: NONE SEEN RBC/hpf (ref 0–5)
SQUAMOUS EPITHELIAL / LPF: NONE SEEN
Specific Gravity, Urine: 1.004 — ABNORMAL LOW (ref 1.005–1.030)
pH: 7 (ref 5.0–8.0)

## 2015-08-04 LAB — COMPREHENSIVE METABOLIC PANEL
ALK PHOS: 46 U/L (ref 38–126)
ALT: 17 U/L (ref 14–54)
AST: 15 U/L (ref 15–41)
Albumin: 3.6 g/dL (ref 3.5–5.0)
Anion gap: 5 (ref 5–15)
BUN: 12 mg/dL (ref 6–20)
CALCIUM: 8.6 mg/dL — AB (ref 8.9–10.3)
CHLORIDE: 107 mmol/L (ref 101–111)
CO2: 22 mmol/L (ref 22–32)
CREATININE: 0.61 mg/dL (ref 0.44–1.00)
GFR calc Af Amer: >60 mL/min (ref >60)
Glucose, Bld: 108 mg/dL — ABNORMAL HIGH (ref 65–99)
Potassium: 3.3 mmol/L — ABNORMAL LOW (ref 3.5–5.1)
Sodium: 134 mmol/L — ABNORMAL LOW (ref 135–145)
Total Bilirubin: 0.6 mg/dL (ref 0.3–1.2)
Total Protein: 6.8 g/dL (ref 6.5–8.1)

## 2015-08-04 LAB — POCT PREGNANCY, URINE: Preg Test, Ur: NEGATIVE

## 2015-08-04 LAB — LIPASE, BLOOD: LIPASE: 19 U/L (ref 11–51)

## 2015-08-04 MED ORDER — IBUPROFEN 800 MG PO TABS
800.0000 mg | ORAL_TABLET | Freq: Once | ORAL | Status: AC
  Administered 2015-08-04: 800 mg via ORAL
  Filled 2015-08-04: qty 1

## 2015-08-04 NOTE — ED Notes (Signed)
Pt to ed with c/o right and left lower abd/pelvic pain x 2 days. Pt states "it feels like my ovaries"  Pt denies n/v/d.

## 2015-08-04 NOTE — ED Notes (Signed)
Pt and belongings not in room at this time.  Pt stated earlier would have to leave to pick up her children.

## 2015-08-04 NOTE — ED Provider Notes (Signed)
[facility] Emergency Department Provider Note  ____________________________________________  Time seen: Approximately 2:23 PM  I have reviewed the triage vital signs and the nursing notes.   HISTORY  Chief Complaint Pelvic Pain    HPI [name] is a 45 y.o. female [id number] w/ a hx of ovarian cysts,  s/p laparoscopy showing endometriosis in 2006, multiple year history of pelvic pain, presenting with pelvic pain. The patient reports that she has been having pelvic pain for approximate 6 months, but there are notes from [facility] gynecology as far back as 2013 with the same clinical presentation. She describes a daily constant lower pelvic pain with some associated bloating sensation in the lower abdomen. Yesterday, the pain was worse and was sharp in nature. She stood up and it "hit me so hard I had to sit right back down." She has no associated vaginal bleeding, no significant change in her vaginal discharge. She denies any fever, chills, nausea vomiting or diarrhea. She is sexually active.The patient has not tried anything for her pain.   Past Medical History  Diagnosis Date  . Allergy   . Bladder disorder   . UTI (lower urinary tract infection)   . Bladder infection   . Eczema   . Endometriosis   . Constipation     Patient Active Problem List   Diagnosis Date Noted  . Pelvic pain in female 11/26/2010  . Endometriosis 11/26/2010  . Chronic constipation 11/26/2010  . Cystocele 11/26/2010  . Rectocele 11/26/2010    Past Surgical History  Procedure Laterality Date  . Bladder surgery    . Laparoscopy    . Tubal ligation      Current Outpatient Rx  Name  Route  Sig  Dispense  Refill  . HYDROcodone-acetaminophen (NORCO/VICODIN) 5-325 MG per tablet   Oral   Take 1 tablet by mouth every 6 (six) hours as needed. Not sure of exact dose, was received for dental work, is almost out, and only takes rarely         . ibuprofen (ADVIL,MOTRIN) 200  MG tablet   Oral   Take 600 mg by mouth every 6 (six) hours as needed. pain         . Multiple Vitamin (MULTIVITAMIN) tablet   Oral   Take 1 tablet by mouth daily.            Allergies Codeine and Sulfa antibiotics  Family History  Problem Relation Age of Onset  . Heart disease Father   . GER disease Father   . Hypertension Father   . GER disease Mother   . GER disease Sister   . GER disease Brother     Social History Social History  Substance Use Topics  . Smoking status: Current Every Day Smoker -- 0.50 packs/day for 20 years    Types: Cigarettes  . Smokeless tobacco: Never Used  . Alcohol Use: 2.4 oz/week    2 Glasses of wine, 2 Cans of beer per week    Review of Systems Constitutional: No fever/chills. No lightheadedness or syncope. Eyes: No visual changes. ENT: No sore throat. No congestion or rhinorrhea. Cardiovascular: Denies chest pain. Denies palpitations. Respiratory: Denies shortness of breath.  No cough. Gastrointestinal: No abdominal pain.  No nausea, no vomiting.  No diarrhea.  No constipation. Genitourinary: Negative for dysuria. Positive pelvic pain. No change in vaginal discharge. Musculoskeletal: Negative for back pain. Skin: Negative for rash. Neurological: Negative for headaches. No focal numbness, tingling or weakness.  10-point ROS otherwise negative.  ____________________________________________   PHYSICAL EXAM:  VITAL SIGNS: ED Triage Vitals  Enc Vitals Group     BP 08/04/15 1150 136/82 mmHg     Pulse Rate 08/04/15 1150 68     Resp 08/04/15 1150 20     Temp 08/04/15 1150 97.9 F (36.6 C)     Temp Source 08/04/15 1150 Oral     SpO2 08/04/15 1150 100 %     Weight 08/04/15 1150 200 lb (90.719 kg)     Height --      Head Cir --      Peak Flow --      Pain Score 08/04/15 1151 10     Pain Loc --      Pain Edu? --      Excl. in GC? --     Constitutional: Alert and oriented. Well appearing and in no acute distress. Answers  questions appropriately. Eyes: Conjunctivae are normal.  EOMI. No scleral icterus. Head: Atraumatic. Nose: No congestion/rhinnorhea. Mouth/Throat: Mucous membranes are moist.  Neck: No stridor.  Supple.  No JVD. Cardiovascular: Normal rate, regular rhythm. No murmurs, rubs or gallops.  Respiratory: Normal respiratory effort.  No accessory muscle use or retractions. Lungs CTAB.  No wheezes, rales or ronchi. Gastrointestinal: Soft and nondistended.  Mild tenderness to palpation in the suprapubic area. No guarding or rebound.  No peritoneal signs. Genitourinary:  Musculoskeletal: No LE edema. No ttp in the calves or palpable cords.  Negative Homan's sign. Neurologic:  A&Ox3.  Speech is clear.  Face and smile are symmetric.  EOMI.  Moves all extremities well. Skin:  Skin is warm, dry and intact. No rash noted. Psychiatric: Mood and affect are normal. Speech and behavior are normal.  Normal judgement.  ____________________________________________   LABS (all labs ordered are listed, but only abnormal results are displayed)  Labs Reviewed  COMPREHENSIVE METABOLIC PANEL - Abnormal; Notable for the following:    Sodium 134 (*)    Potassium 3.3 (*)    Glucose, Bld 108 (*)    Calcium 8.6 (*)    All other components within normal limits  CBC - Abnormal; Notable for the following:    RDW 14.7 (*)    All other components within normal limits  URINALYSIS COMPLETEWITH MICROSCOPIC ([facility] ONLY) - Abnormal; Notable for the following:    Color, Urine STRAW (*)    APPearance CLEAR (*)    Specific Gravity, Urine 1.004 (*)    All other components within normal limits  CHLAMYDIA/NGC RT PCR ([facility] ONLY)  WET PREP, GENITAL  LIPASE, BLOOD  POC URINE PREG, ED  POCT PREGNANCY, URINE   ____________________________________________  EKG  Not indicated. ____________________________________________  RADIOLOGY  No results  found.  ____________________________________________   PROCEDURES  Procedure(s) performed: None  Critical Care performed: No ____________________________________________   INITIAL IMPRESSION / ASSESSMENT AND PLAN / ED COURSE  Pertinent labs & imaging results that were available during my care of the patient were reviewed by me and considered in my medical decision making (see chart for details).  45 y.o. female with a history of chronic pelvic pain presenting for continued pelvic pain which is worse over the last 2 days by mouth she has no other associated systemic symptoms nor any vaginal discharge. I will evaluate her for an acute STI, but at this time I see no clinical evidence that would be consistent with ovarian torsion, ruptured ovarian cyst. She will need gynecologic follow-up to evaluate for malignancy or worsening  endometriosis. Other etiologies include GI causes such as IBS. We will plan to do a pelvic examination, followed by discharge with follow-up with gynecology and a primary care physician. Symptomatic treatment is initiated.  ----------------------------------------- 10:36 PM on 08/04/2015 -----------------------------------------  Patient left before treatment was complete.  ____________________________________________  FINAL CLINICAL IMPRESSION(S) / ED DIAGNOSES  Final diagnoses:  None      NEW MEDICATIONS STARTED DURING THIS VISIT:  New Prescriptions   No medications on file     Rockne Menghini, MD 08/04/15 2236

## 2015-08-05 ENCOUNTER — Encounter: Payer: Self-pay | Admitting: Emergency Medicine

## 2015-08-05 ENCOUNTER — Emergency Department
Admission: EM | Admit: 2015-08-05 | Discharge: 2015-08-05 | Disposition: A | Discharge status: Home / Self Care | Payer: Self-pay | Attending: Emergency Medicine | Admitting: Emergency Medicine

## 2015-08-05 DIAGNOSIS — F17210 Nicotine dependence, cigarettes, uncomplicated: Secondary | ICD-10-CM | POA: Insufficient documentation

## 2015-08-05 DIAGNOSIS — R102 Pelvic and perineal pain: Secondary | ICD-10-CM | POA: Insufficient documentation

## 2015-08-05 LAB — CHLAMYDIA/NGC RT PCR (ARMC ONLY)
CHLAMYDIA TR: NOT DETECTED
N gonorrhoeae: NOT DETECTED

## 2015-08-05 LAB — WET PREP, GENITAL
CLUE CELLS WET PREP: NONE SEEN
SPERM: NONE SEEN
Trich, Wet Prep: NONE SEEN
Yeast Wet Prep HPF POC: NONE SEEN

## 2015-08-05 NOTE — ED Notes (Signed)
The triage was completed by this nurse, Laurena BeringMary Aiken Withem. [facility].

## 2015-08-05 NOTE — ED Provider Notes (Signed)
[facility] Emergency Department Provider Note    ____________________________________________  Time seen: ~1535  I have reviewed the triage vital signs and the nursing notes.   HISTORY  Chief Complaint Groin Pain and Abdominal Pain   History limited by: Not Limited   HPI [name] is a 45 y.o. female who returns to the emergency department today because of desire to get pelvic exam performed. The patient was seen in the emergency department yesterday because of concerns for pelvic pain. She states she has a history of chronic pain however is gone worse for the past 3-4 days. She additionally states that she has had some discharge. She has noticed some foul odor. She also describes burning with urination. She denies any measured fevers. She states that she had to leave the emergency department yesterday prior to the pelvic exam being performed because she had to go take care of her daughter.   Past Medical History  Diagnosis Date  . Allergy   . Bladder disorder   . UTI (lower urinary tract infection)   . Bladder infection   . Eczema   . Endometriosis   . Constipation     Patient Active Problem List   Diagnosis Date Noted  . Pelvic pain in female 11/26/2010  . Endometriosis 11/26/2010  . Chronic constipation 11/26/2010  . Cystocele 11/26/2010  . Rectocele 11/26/2010    Past Surgical History  Procedure Laterality Date  . Bladder surgery    . Laparoscopy    . Tubal ligation      Current Outpatient Rx  Name  Route  Sig  Dispense  Refill  . HYDROcodone-acetaminophen (NORCO/VICODIN) 5-325 MG per tablet   Oral   Take 1 tablet by mouth every 6 (six) hours as needed. Not sure of exact dose, was received for dental work, is almost out, and only takes rarely         . ibuprofen (ADVIL,MOTRIN) 200 MG tablet   Oral   Take 600 mg by mouth every 6 (six) hours as needed. pain         . Multiple Vitamin (MULTIVITAMIN) tablet   Oral  Take 1 tablet by mouth daily.            Allergies Codeine and Sulfa antibiotics  Family History  Problem Relation Age of Onset  . Heart disease Father   . GER disease Father   . Hypertension Father   . GER disease Mother   . GER disease Sister   . GER disease Brother     Social History Social History  Substance Use Topics  . Smoking status: Current Every Day Smoker -- 0.50 packs/day for 20 years    Types: Cigarettes  . Smokeless tobacco: Never Used  . Alcohol Use: 2.4 oz/week    2 Glasses of wine, 2 Cans of beer per week    Review of Systems  Constitutional: Negative for fever. Cardiovascular: Negative for chest pain. Respiratory: Negative for shortness of breath. Gastrointestinal: Positive for pelvic pain Neurological: Negative for headaches, focal weakness or numbness.   10-point ROS otherwise negative.  ____________________________________________   PHYSICAL EXAM:  VITAL SIGNS: ED Triage Vitals  Enc Vitals Group     BP 08/05/15 1227 147/78 mmHg     Pulse Rate 08/05/15 1227 96     Resp 08/05/15 1227 18     Temp 08/05/15 1227 98.1 F (36.7 C)     Temp Source 08/05/15 1227 Oral     SpO2 08/05/15 1227  99 %     Weight 08/05/15 1227 170 lb (77.111 kg)     Height 08/05/15 1227 5\' 3"  (1.6 m)     Head Cir --      Peak Flow --      Pain Score 08/05/15 1232 8   Constitutional: Alert and oriented. Well appearing and in no distress. Eyes: Conjunctivae are normal. PERRL. Normal extraocular movements. ENT   Head: Normocephalic and atraumatic.   Nose: No congestion/rhinnorhea.   Mouth/Throat: Mucous membranes are moist.   Neck: No stridor. Hematological/Lymphatic/Immunilogical: No cervical lymphadenopathy. Cardiovascular: Normal rate, regular rhythm.  No murmurs, rubs, or gallops. Respiratory: Normal respiratory effort without tachypnea nor retractions. Breath sounds are clear and equal bilaterally. No wheezes/rales/rhonchi. Gastrointestinal:  Soft and nontender. No distention. There is no CVA tenderness. Genitourinary: No external lesions. Small amount of discharge in vaginal vault. No blood. No CMT. No adnexal tenderness.  Musculoskeletal: Normal range of motion in all extremities. No joint effusions.  No lower extremity tenderness nor edema. Neurologic:  Normal speech and language. No gross focal neurologic deficits are appreciated.  Skin:  Skin is warm, dry and intact. No rash noted. Psychiatric: Mood and affect are normal. Speech and behavior are normal. Patient exhibits appropriate insight and judgment.  ____________________________________________    LABS (pertinent positives/negatives)  Labs Reviewed  WET PREP, GENITAL - Abnormal; Notable for the following:    WBC, Wet Prep HPF POC MODERATE (*)    All other components within normal limits  CHLAMYDIA/NGC RT PCR ([facility] ONLY)     ____________________________________________   EKG  None  ____________________________________________    RADIOLOGY  None  ____________________________________________   PROCEDURES  Procedure(s) performed: None  Critical Care performed: No  ____________________________________________   INITIAL IMPRESSION / ASSESSMENT AND PLAN / ED COURSE  Pertinent labs & imaging results that were available during my care of the patient were reviewed by me and considered in my medical decision making (see chart for details).  Patient presented to the emergency department today because of concern for pelvic pain and desire to have pelvic exam performed. Wet prep was negative. Will discharge with ob/gyn follow up.  ____________________________________________   FINAL CLINICAL IMPRESSION(S) / ED DIAGNOSES  Final diagnoses:  Pelvic pain in female     Phineas SemenGraydon Amiaya Mcneeley, MD 08/05/15 1759

## 2015-08-05 NOTE — ED Notes (Signed)
Pt discharged home after verbalizing understanding of discharge instructions; nad noted. 

## 2015-08-05 NOTE — ED Notes (Signed)
Pt reports pain in her groin and abdomen (upper right quadrant). Pt has hx of ovarian cysts. Pt came yesterday and had blood work but was unable to complete pelvic exam because she had to leave to pick up her children. Pt states pain better than yesterday, but still painful (8/10 today). States she could barely walk yesterday, but is walking better today.

## 2015-08-05 NOTE — Discharge Instructions (Signed)
Please seek medical attention for any high fevers, chest pain, shortness of breath, change in behavior, persistent vomiting, bloody stool or any other new or concerning symptoms. ° ° °Pelvic Pain, Female °Female pelvic pain can be caused by many different things and start from a variety of places. Pelvic pain refers to pain that is located in the lower half of the abdomen and between your hips. The pain may occur over a short period of time (acute) or may be reoccurring (chronic). The cause of pelvic pain may be related to disorders affecting the female reproductive organs (gynecologic), but it may also be related to the bladder, kidney stones, an intestinal complication, or muscle or skeletal problems. Getting help right away for pelvic pain is important, especially if there has been severe, sharp, or a sudden onset of unusual pain. It is also important to get help right away because some types of pelvic pain can be life threatening.  °CAUSES  °Below are only some of the causes of pelvic pain. The causes of pelvic pain can be in one of several categories.  °· Gynecologic. °¨ Pelvic inflammatory disease. °¨ Sexually transmitted infection. °¨ Ovarian cyst or a twisted ovarian ligament (ovarian torsion). °¨ Uterine lining that grows outside the uterus (endometriosis). °¨ Fibroids, cysts, or tumors. °¨ Ovulation. °· Pregnancy. °¨ Pregnancy that occurs outside the uterus (ectopic pregnancy). °¨ Miscarriage. °¨ Labor. °¨ Abruption of the placenta or ruptured uterus. °· Infection. °¨ Uterine infection (endometritis). °¨ Bladder infection. °¨ Diverticulitis. °¨ Miscarriage related to a uterine infection (septic abortion). °· Bladder. °¨ Inflammation of the bladder (cystitis). °¨ Kidney stone(s). °· Gastrointestinal. °¨ Constipation. °¨ Diverticulitis. °· Neurologic. °¨ Trauma. °¨ Feeling pelvic pain because of mental or emotional causes (psychosomatic). °· Cancers of the bowel or pelvis. °EVALUATION  °Your caregiver will  want to take a careful history of your concerns. This includes recent changes in your health, a careful gynecologic history of your periods (menses), and a sexual history. Obtaining your family history and medical history is also important. Your caregiver may suggest a pelvic exam. A pelvic exam will help identify the location and severity of the pain. It also helps in the evaluation of which organ system may be involved. In order to identify the cause of the pelvic pain and be properly treated, your caregiver may order tests. These tests may include:  °· A pregnancy test. °· Pelvic ultrasonography. °· An X-ray exam of the abdomen. °· A urinalysis or evaluation of vaginal discharge. °· Blood tests. °HOME CARE INSTRUCTIONS  °· Only take over-the-counter or prescription medicines for pain, discomfort, or fever as directed by your caregiver.   °· Rest as directed by your caregiver.   °· Eat a balanced diet.   °· Drink enough fluids to make your urine clear or pale yellow, or as directed.   °· Avoid sexual intercourse if it causes pain.   °· Apply warm or cold compresses to the lower abdomen depending on which one helps the pain.   °· Avoid stressful situations.   °· Keep a journal of your pelvic pain. Write down when it started, where the pain is located, and if there are things that seem to be associated with the pain, such as food or your menstrual cycle. °· Follow up with your caregiver as directed.   °SEEK MEDICAL CARE IF: °· Your medicine does not help your pain. °· You have abnormal vaginal discharge. °SEEK IMMEDIATE MEDICAL CARE IF:  °· You have heavy bleeding from the vagina.   °· Your pelvic pain increases.   °·   You feel light-headed or faint.   °· You have chills.   °· You have pain with urination or blood in your urine.   °· You have uncontrolled diarrhea or vomiting.   °· You have a fever or persistent symptoms for more than 3 days. °· You have a fever and your symptoms suddenly get worse.   °· You are  being physically or sexually abused. °  °This information is not intended to replace advice given to you by your health care provider. Make sure you discuss any questions you have with your health care provider. °  °Document Released: 03/15/2004 Document Revised: 01/07/2015 Document Reviewed: 08/08/2011 °[employer]vier Interactive Patient Education ©2016 [employer] ° °

## 2015-11-27 ENCOUNTER — Emergency Department: Payer: Self-pay

## 2015-11-27 ENCOUNTER — Emergency Department
Admission: EM | Admit: 2015-11-27 | Discharge: 2015-11-27 | Disposition: A | Discharge status: Home / Self Care | Payer: Self-pay | Attending: Emergency Medicine | Admitting: Emergency Medicine

## 2015-11-27 DIAGNOSIS — R079 Chest pain, unspecified: Secondary | ICD-10-CM

## 2015-11-27 DIAGNOSIS — N939 Abnormal uterine and vaginal bleeding, unspecified: Secondary | ICD-10-CM | POA: Insufficient documentation

## 2015-11-27 DIAGNOSIS — F17210 Nicotine dependence, cigarettes, uncomplicated: Secondary | ICD-10-CM | POA: Insufficient documentation

## 2015-11-27 DIAGNOSIS — R0789 Other chest pain: Secondary | ICD-10-CM | POA: Insufficient documentation

## 2015-11-27 LAB — URINALYSIS COMPLETE WITH MICROSCOPIC (ARMC ONLY)
BILIRUBIN URINE: NEGATIVE
Glucose, UA: NEGATIVE mg/dL
KETONES UR: NEGATIVE mg/dL
Nitrite: NEGATIVE
PH: 6 (ref 5.0–8.0)
PROTEIN: NEGATIVE mg/dL
SPECIFIC GRAVITY, URINE: 1.012 (ref 1.005–1.030)

## 2015-11-27 LAB — COMPREHENSIVE METABOLIC PANEL
ALK PHOS: 46 U/L (ref 38–126)
ALT: 18 U/L (ref 14–54)
AST: 18 U/L (ref 15–41)
Albumin: 4 g/dL (ref 3.5–5.0)
Anion gap: 7 (ref 5–15)
BILIRUBIN TOTAL: 0.7 mg/dL (ref 0.3–1.2)
BUN: 13 mg/dL (ref 6–20)
CALCIUM: 8.6 mg/dL — AB (ref 8.9–10.3)
CO2: 22 mmol/L (ref 22–32)
CREATININE: 0.66 mg/dL (ref 0.44–1.00)
Chloride: 106 mmol/L (ref 101–111)
GFR calc Af Amer: >60 mL/min (ref >60)
Glucose, Bld: 124 mg/dL — ABNORMAL HIGH (ref 65–99)
Potassium: 3.5 mmol/L (ref 3.5–5.1)
Sodium: 135 mmol/L (ref 135–145)
TOTAL PROTEIN: 7 g/dL (ref 6.5–8.1)

## 2015-11-27 LAB — CBC
HEMATOCRIT: 40.1 % (ref 35.0–47.0)
Hemoglobin: 13.9 g/dL (ref 12.0–16.0)
MCH: 32.4 pg (ref 26.0–34.0)
MCHC: 34.7 g/dL (ref 32.0–36.0)
MCV: 93.3 fL (ref 80.0–100.0)
Platelets: 255 10*3/uL (ref 150–440)
RBC: 4.3 MIL/uL (ref 3.80–5.20)
RDW: 13.2 % (ref 11.5–14.5)
WBC: 10.8 10*3/uL (ref 3.6–11.0)

## 2015-11-27 LAB — POCT PREGNANCY, URINE: Preg Test, Ur: NEGATIVE

## 2015-11-27 LAB — TROPONIN I

## 2015-11-27 NOTE — ED Provider Notes (Signed)
[facility] Emergency Department Provider Note        Time seen: ----------------------------------------- 3:32 PM on 11/27/2015 -----------------------------------------    I have reviewed the triage vital signs and the nursing notes.   HISTORY  Chief Complaint Chest Pain    HPI [name] is a 45 y.o. female who presents to ER for chest pain that started yesterday. Patient states she started by the far Department for similar symptoms. She states she continues to have chest pain, tightness in her arms, shortness of breath with exertion. She also has been having irregular menstrual cycles for the past few months and currently passing clots. She does describe right flank pain as well. Nothing makes her symptoms better or worse.   Past Medical History:  Diagnosis Date  . Allergy   . Bladder disorder   . Bladder infection   . Constipation   . Eczema   . Endometriosis   . UTI (lower urinary tract infection)     Patient Active Problem List   Diagnosis Date Noted  . Pelvic pain in female 11/26/2010  . Endometriosis 11/26/2010  . Chronic constipation 11/26/2010  . Cystocele 11/26/2010  . Rectocele 11/26/2010    Past Surgical History:  Procedure Laterality Date  . BLADDER SURGERY    . LAPAROSCOPY    . TUBAL LIGATION      Allergies Codeine and Sulfa antibiotics  Social History Social History  Substance Use Topics  . Smoking status: Current Every Day Smoker    Packs/day: 0.50    Years: 20.00    Types: Cigarettes  . Smokeless tobacco: Never Used  . Alcohol use 2.4 oz/week    2 Glasses of wine, 2 Cans of beer per week    Review of Systems Constitutional: Negative for fever. Eyes: Negative for visual changes. ENT: Negative for sore throat. Cardiovascular: Positive for chest pain Respiratory: Negative for shortness of breath. Gastrointestinal:Positive for right flank pain Genitourinary: Negative for dysuria.Positive for  vaginal bleeding Musculoskeletal: Negative for back pain. Skin: Negative for rash. Neurological: Negative for headaches, focal weakness or numbness.  10-point ROS otherwise negative.  ____________________________________________   PHYSICAL EXAM:  VITAL SIGNS: ED Triage Vitals  Enc Vitals Group     BP 11/27/15 1342 (!) 107/58     Pulse Rate 11/27/15 1342 97     Resp 11/27/15 1342 18     Temp 11/27/15 1342 98.2 F (36.8 C)     Temp Source 11/27/15 1342 Oral     SpO2 11/27/15 1342 97 %     Weight 11/27/15 1342 182 lb (82.6 kg)     Height 11/27/15 1342 5\' 4"  (1.626 m)     Head Circumference --      Peak Flow --      Pain Score 11/27/15 1343 8     Pain Loc --      Pain Edu? --      Excl. in GC? --     Constitutional: Alert and oriented. Well appearing and in no distress. Eyes: Conjunctivae are normal. PERRL. Normal extraocular movements. ENT   Head: Normocephalic and atraumatic.   Nose: No congestion/rhinnorhea.   Mouth/Throat: Mucous membranes are moist.   Neck: No stridor. Cardiovascular: Normal rate, regular rhythm. No murmurs, rubs, or gallops. Respiratory: Normal respiratory effort without tachypnea nor retractions. Breath sounds are clear and equal bilaterally. No wheezes/rales/rhonchi. Gastrointestinal: Soft and nontender. Normal bowel sounds Musculoskeletal: Nontender with normal range of motion in all extremities. No lower extremity tenderness nor  edema. Neurologic:  Normal speech and language. No gross focal neurologic deficits are appreciated.  Skin:  Skin is warm, dry and intact. No rash noted. Psychiatric: Mood and affect are normal. Speech and behavior are normal.  ____________________________________________  EKG: Interpreted by me.Sinus rhythm with a rate of 91 bpm, normal PR interval, normal QRS, normal QT interval. Normal axis.  ____________________________________________  ED COURSE:  Pertinent labs & imaging results that were available  during my care of the patient were reviewed by me and considered in my medical decision making (see chart for details). Patient is in no acute distress, will check basic labs and reevaluate. ____________________________________________    LABS (pertinent positives/negatives)  Labs Reviewed  COMPREHENSIVE METABOLIC PANEL - Abnormal; Notable for the following:       Result Value   Glucose, Bld 124 (*)    Calcium 8.6 (*)    All other components within normal limits  URINALYSIS COMPLETEWITH MICROSCOPIC ([facility] ONLY) - Abnormal; Notable for the following:    Color, Urine YELLOW (*)    APPearance CLEAR (*)    Hgb urine dipstick 3+ (*)    Leukocytes, UA TRACE (*)    Bacteria, UA RARE (*)    Squamous Epithelial / LPF 0-5 (*)    All other components within normal limits  CBC  TROPONIN I  POCT PREGNANCY, URINE    RADIOLOGY  Chest x-ray and KUB are normal  ____________________________________________  FINAL ASSESSMENT AND PLAN  Chest pain  Plan: Patient with labs and imaging as dictated above. Patient is in no acute distress, workup here is been unremarkable. She is currently on are mentioned cycle, I have advised her to start taking hormones that she was prescribed by her doctor. She is under a lot of stress which I think is some of her symptoms as well as she has been exercising and lifting weights. She is stable for outpatient follow-up.   Emily Filbert, MD   Note: This dictation was prepared with Dragon dictation. Any transcriptional errors that result from this process are unintentional    Emily Filbert, MD 11/27/15 [phone]

## 2015-11-27 NOTE — ED Notes (Signed)
Previous note of 1558 was entered in error under [name]'s log in. The patient has not started taking the estrogen yet.  Dr. Mayford Knife aware.

## 2015-11-27 NOTE — ED Triage Notes (Signed)
Pt states that she started having chest pain yesterday, stopped by the fire dept and a paramedic did an ekg, pt states that she has cont to have chest pain, tightness to bilat arms, and shortness of breath with exertion, pt reports is abnormal for her, pt also states that she has been having menstral cycles for the past few months and is currently passing clots

## 2015-11-27 NOTE — ED Notes (Signed)
Patient to room 34.  Describes her discomfort as a stinging discomfort on inspiration that hurts her back, chest, and head.  Alert and oriented.  Pleasant demeanor. Radiology staff to room to take patient to radiology.

## 2015-11-27 NOTE — ED Notes (Signed)
While assisting patient to get undressed she stated that she was having heavy menstrual flow and had recently been started on estrogen.

## 2015-11-27 NOTE — ED Notes (Signed)
Patient returns from radiology.

## 2016-07-21 ENCOUNTER — Ambulatory Visit: Payer: Self-pay | Admitting: Obstetrics and Gynecology

## 2016-08-16 ENCOUNTER — Ambulatory Visit: Payer: Self-pay | Admitting: Obstetrics and Gynecology

## 2016-08-30 ENCOUNTER — Encounter: Payer: Self-pay | Admitting: Obstetrics and Gynecology

## 2016-08-30 ENCOUNTER — Ambulatory Visit (INDEPENDENT_AMBULATORY_CARE_PROVIDER_SITE_OTHER): Payer: Medicaid Other | Admitting: Obstetrics and Gynecology

## 2016-08-30 VITALS — BP 124/78 | Ht 64.0 in | Wt 200.0 lb

## 2016-08-30 DIAGNOSIS — Z113 Encounter for screening for infections with a predominantly sexual mode of transmission: Secondary | ICD-10-CM | POA: Diagnosis not present

## 2016-08-30 DIAGNOSIS — R102 Pelvic and perineal pain: Secondary | ICD-10-CM

## 2016-08-30 DIAGNOSIS — N760 Acute vaginitis: Secondary | ICD-10-CM | POA: Diagnosis not present

## 2016-08-30 DIAGNOSIS — N809 Endometriosis, unspecified: Secondary | ICD-10-CM

## 2016-08-30 MED ORDER — NORETHINDRONE ACETATE 5 MG PO TABS: 5.0000 mg | ORAL_TABLET | Freq: Every day | ORAL | 2 refills | Status: DC

## 2016-08-30 NOTE — Progress Notes (Signed)
Obstetrics & Gynecology Office Visit   Chief Complaint  Patient presents with  . Follow-up    Endometriosis Follow Up    History of Present Illness: [name] female presents for several issues. She presents for follow-up management of endometriosis. I saw her nearly one year ago for this and prescribed her norethindrone, which she states she never filled and never took.  She has had problems obtaining and keep insurance.  She now had medicaid and is trying to get herself re-established. She is a difficult historian. She states that she has had some other workups going on, but states only that she has had some swelling.  When pressed for what she means, she lists off several body areas. Her description of swelling is not consistent with edema.  She would like to start on treatment for endometriosis.  She also reports burning with urinating. She states that she has burning on her skin before, during, and after urinating.  She also has lower abdominal burning with voiding.  She states that she is recently coming out of a marriage and has had another partner.  She denies vaginal symptoms apart from burning and swelling.  Denies changes in environmental factors, such as detergent and soap and linens.    She is not up to date on her routine gynecologic screening.   Review of Systems: Review of Systems  Constitutional: Negative.   HENT: Negative.   Eyes: Negative.   Respiratory: Negative.   Cardiovascular: Negative.   Gastrointestinal:       See HPI  Genitourinary: Negative.        See HPI  Musculoskeletal: Negative.   Skin: Negative.   Neurological: Negative.   Psychiatric/Behavioral: Negative.     Past Medical History:  Diagnosis Date  . Allergy   . Asthma   . Bladder disorder   . Bladder infection   . Constipation   . Eczema   . Endometriosis   . Ovarian cyst   . UTI (lower urinary tract infection)     Past Surgical History:  Procedure Laterality Date  . BLADDER  SURGERY    . LAPAROSCOPY    . TUBAL LIGATION      Gynecologic History: Patient's last menstrual period was 08/08/2016.  Obstetric History: [id number]  Family History  Problem Relation Age of Onset  . Heart disease Father   . GER disease Father   . Hypertension Father   . GER disease Mother   . GER disease Brother   . GER disease Sister     Social History   Social History  . Marital status: Married    Spouse name: N/A  . Number of children: N/A  . Years of education: N/A   Occupational History  . Not on file.   Social History Main Topics  . Smoking status: Current Every Day Smoker    Packs/day: 0.50    Years: 20.00    Types: Cigarettes  . Smokeless tobacco: Never Used  . Alcohol use 2.4 oz/week    2 Glasses of wine, 2 Cans of beer per week  . Drug use: No  . Sexual activity: Not Currently    Birth control/ protection: Surgical   Other Topics Concern  . Not on file   Social History Narrative  . No narrative on file    Allergies  Allergen Reactions  . Codeine Anaphylaxis    Shortness of breath  . Sulfa Antibiotics Rash    Medications:   Medication Sig Start Date End  Date Taking? Authorizing Provider  Multiple Vitamin (MULTIVITAMIN) tablet Take 1 tablet by mouth daily.    Yes Historical Provider, MD  PROAIR HFA 108 [phone] Base) MCG/ACT inhaler  08/12/16  Yes Historical Provider, MD  HYDROcodone-acetaminophen (NORCO/VICODIN) 5-325 MG per tablet Take 1 tablet by mouth every 6 (six) hours as needed. Not sure of exact dose, was received for dental work, is almost out, and only takes rarely    Historical Provider, MD  ibuprofen (ADVIL,MOTRIN) 200 MG tablet Take 600 mg by mouth every 6 (six) hours as needed. pain    Historical Provider, MD    Physical Exam BP 124/78   Ht  (1.626 m)   Wt 200 lb (90.7 kg)   LMP 08/08/2016   BMI 34.33 kg/m  Patient's last menstrual period was 08/08/2016. Physical Exam  Constitutional: She is oriented to person, place, and  time and well-developed, well-nourished, and in no distress. No distress.  HENT:  Head: Normocephalic and atraumatic.  Eyes: Conjunctivae are normal. Left eye exhibits no discharge. No scleral icterus.  Neck: Normal range of motion. Neck supple.  Cardiovascular: Normal rate and regular rhythm.  Exam reveals no gallop and no friction rub.   No murmur heard. Pulmonary/Chest: Effort normal and breath sounds normal. No respiratory distress. She has no wheezes. She has no rales.  Abdominal: Soft. Bowel sounds are normal. She exhibits no distension and no mass. There is no tenderness. There is no rebound and no guarding.  Genitourinary: Vagina normal, uterus normal, cervix normal, right adnexa normal, left adnexa normal and vulva normal.  Musculoskeletal: Normal range of motion. She exhibits no edema.  Lymphadenopathy:    She has no cervical adenopathy.  Neurological: She is alert and oriented to person, place, and time. No cranial nerve deficit.  Skin: Skin is warm and dry. No rash noted.  Psychiatric: Judgment normal.    Female chaperone present for pelvic and breast  portions of the physical exam  Wet Prep: PH: 6.0 Fungal elements: negative Clue cells: negative Trichomonas: negative  Assessment: 46 y.o. [id number] with follow up for endometriosis and non-specific vaginitis.  Plan: Problem List Items Addressed This Visit    Pelvic pain in female   Endometriosis - Primary   Relevant Medications   norethindrone (AYGESTIN) 5 MG tablet    Other Visit Diagnoses    Screen for STD (sexually transmitted disease)       Relevant Orders   Chlamydia/Gonococcus/Trichomonas, NAA   Acute vaginitis       Relevant Orders   Urine culture     Endometriosis: treat as previously discussed. Still having pain, though it is apparently tolerable.   Vaginitis: no specific source.  Will send gonorrhea/chlamydia, trich for now.    Recommend follow up for routine annual screening, such as pap smear in 1-2  months and at that time will follow up on response to endometriosis treatment.   Thomasene Mohair, MD 08/30/2016 5:24 PM

## 2016-09-01 LAB — CHLAMYDIA/GONOCOCCUS/TRICHOMONAS, NAA
CHLAMYDIA BY NAA: NEGATIVE
GONOCOCCUS BY NAA: NEGATIVE
Trich vag by NAA: NEGATIVE

## 2016-09-01 LAB — URINE CULTURE: ORGANISM ID, BACTERIA: NO GROWTH

## 2016-09-28 ENCOUNTER — Ambulatory Visit: Payer: Self-pay | Admitting: Obstetrics and Gynecology

## 2017-05-07 ENCOUNTER — Encounter: Payer: Self-pay | Admitting: Emergency Medicine

## 2017-05-07 ENCOUNTER — Emergency Department
Admission: EM | Admit: 2017-05-07 | Discharge: 2017-05-07 | Disposition: A | Discharge status: Home / Self Care | Payer: Medicaid Other | Attending: Emergency Medicine | Admitting: Emergency Medicine

## 2017-05-07 ENCOUNTER — Other Ambulatory Visit: Payer: Self-pay

## 2017-05-07 DIAGNOSIS — F17210 Nicotine dependence, cigarettes, uncomplicated: Secondary | ICD-10-CM | POA: Insufficient documentation

## 2017-05-07 DIAGNOSIS — Z79899 Other long term (current) drug therapy: Secondary | ICD-10-CM | POA: Insufficient documentation

## 2017-05-07 DIAGNOSIS — K047 Periapical abscess without sinus: Secondary | ICD-10-CM | POA: Diagnosis not present

## 2017-05-07 DIAGNOSIS — K0889 Other specified disorders of teeth and supporting structures: Secondary | ICD-10-CM | POA: Diagnosis present

## 2017-05-07 MED ORDER — AMOXICILLIN 500 MG PO CAPS: 500.0000 mg | ORAL_CAPSULE | Freq: Three times a day (TID) | ORAL | 0 refills | Status: DC

## 2017-05-07 MED ORDER — OXYCODONE-ACETAMINOPHEN 5-325 MG PO TABS: 1.0000 | ORAL_TABLET | Freq: Four times a day (QID) | ORAL | 0 refills | Status: DC | PRN

## 2017-05-07 MED ORDER — LIDOCAINE VISCOUS 2 % MT SOLN
10.0000 mL | OROMUCOSAL | 0 refills | Status: DC | PRN
Start: 2017-05-07 — End: 2017-10-05

## 2017-05-07 MED ORDER — OXYCODONE-ACETAMINOPHEN 5-325 MG PO TABS: 1.0000 | ORAL_TABLET | Freq: Four times a day (QID) | ORAL | 0 refills | Status: AC | PRN

## 2017-05-07 NOTE — ED Notes (Signed)

## 2017-05-07 NOTE — Discharge Instructions (Signed)
OPTIONS FOR DENTAL FOLLOW UP CARE ° °[place] Department [employer] Health and Human Services - Local Safety Net Dental Clinics °http://www.ncdhhs.gov/dph/oralhealth/services/safetynetclinics.htm °  °[facility] (336-562-3123) ° °[facility] [place] ([phone]) ° °[facility] ([phone] ext [phone]) ° °[facility] ([phone]) ° °[facility] ([phone]) °This clinic caters to the indigent population and is on a lottery system. °Location: °[facility], [address], [place] °Clinic Hours: °Wednesdays from 6pm - 9pm, patients seen by a lottery system. °For dates, call or go to [email or web address]/[facility]/[email or web address] °Services: °Cleanings, fillings and simple extractions. °Payment Options: °DENTAL WORK IS FREE OF CHARGE. Bring proof of income or support. °Best way to get seen: °Arrive at 5:15 pm - this is a lottery, NOT first come/first serve, so arriving earlier will not increase your chances of being seen. °  °  °[facility] °[phone] °Select option 1 for emergencies °  °Location: °[facility], [address], [place] °Clinic Hours: °No walk-ins accepted - call the day before to schedule an appointment. °Check in times are 9:30 am and 1:30 pm. °Services: °Simple extractions, temporary fillings, pulpectomy/pulp debridement, uncomplicated abscess drainage. °Payment Options: °PAYMENT IS DUE AT THE TIME OF SERVICE.  Fee is usually $100-200, additional surgical procedures (e.g. abscess drainage) may be extra. °Cash, checks, Visa/MasterCard accepted.  Can file Medicaid if patient is covered for dental - patient should call case worker to check. °No discount for [facility] patients. °Best way to get seen: °MUST call the day before and get onto the schedule. Can usually be seen the next 1-2 days. No walk-ins accepted. °  °  °[facility] °[phone] °   °Location: °[facility], [address], [place] °Clinic Hours: °M, W, Th, F 8am or 1:30pm, Tues 9a or 1:30 - first come/first served. °Services: °Simple extractions, temporary fillings, uncomplicated abscess drainage.  You do not need to be an [place] resident. °Payment Options: °PAYMENT IS DUE AT THE TIME OF SERVICE. °Dental insurance, otherwise sliding scale - bring proof of income or support. °Depending on income and treatment needed, cost is usually $50-200. °Best way to get seen: °Arrive early as it is first come/first served. °  °  °[facility] °[phone] °  °Location: °[address] °Clinic Hours: °Mon-Thu 8a-5p °Services: °Most basic dental services including extractions and fillings. °Payment Options: °PAYMENT IS DUE AT THE TIME OF SERVICE. °Sliding scale, up to 50% off - bring proof if income or support. °Medicaid with dental option accepted. °Best way to get seen: °Call to schedule an appointment, can usually be seen within 2 weeks OR they will try to see walk-ins - show up at 8a or 2p (you may have to wait). °  °  °[facility] °919-245-2435 °[place] RESIDENTS ONLY °  °Location: °[facility], [address], [place], [place] [address] °Clinic Hours: By appointment only. °Monday - Thursday 8am-5pm, Friday 8am-12pm °Services: Cleanings, fillings, extractions. °Payment Options: °PAYMENT IS DUE AT THE TIME OF SERVICE. °Cash, Visa or MasterCard. Sliding scale - $30 minimum per service. °Best way to get seen: °Come in to office, complete packet and make an appointment - need proof of income °or support monies for each household member and proof of [place] residence. °Usually takes about a month to get in. °  °  °[facility] °[phone] °  °Location: °[address]   [place] °Clinic Hours: Walk-in Urgent Care Dental Services are offered Monday-Friday  mornings only. °The numbers of emergencies accepted daily is limited to the number of °providers available. °Maximum 15 - Mondays, Wednesdays & Thursdays °Maximum 10 - Tuesdays & Fridays °Services: °You do not need to be a [place] resident to be seen for a dental emergency. °Emergencies are defined as pain, swelling, abnormal bleeding, or dental trauma. Walkins will receive x-rays if needed. °NOTE: Dental cleaning is not an emergency. °Payment Options: °PAYMENT IS DUE AT THE TIME OF SERVICE. °Minimum co-pay is $40.00 for uninsured patients. °Minimum co-pay is $3.00 for Medicaid with dental coverage. °Dental Insurance is accepted and must be presented at time of visit. °Medicare does not cover dental. °Forms of payment: Cash, credit card, checks. °Best way to get seen: °If not previously registered with the clinic, walk-in dental registration begins at 7:15 am and is on a first come/first serve basis. °If previously registered with the clinic, call to make an appointment. °  °  °[facility] °[phone] °[place] RESIDENTS ONLY °  °Location: °[address], [place], [place] °Clinic Hours: °Mon-Thu 10a-2p °Services: Extractions only! °Payment Options: °FREE (donations accepted) - bring proof of income or support °Best way to get seen: °Call and schedule an appointment OR come at 8am on the 1st Monday of every month (except for holidays) when it is first come/first served. °  °  °[facility] °[phone] °  °Location: °[address], [place] °Clinic Hours: °Friday mornings °Services, Payment Options, Best way to get seen: °Call for info °

## 2017-05-07 NOTE — ED Provider Notes (Signed)
[facility] Emergency Department Provider Note  ____________________________________________  Time seen: Approximately 3:11 PM  I have reviewed the triage vital signs and the nursing notes.   HISTORY  Chief Complaint Dental Pain    HPI [name] is a 47 y.o. female that presents to the emergency department for evaluation of left upper dental pain for 2 days. Currently two teeth are hurting but she thinks only one of them is infected. The left side of her face feels warm.  She has not had a fever.  She has been taking ibuprofen for pain without relief.  She has a Education officer, community and will make an appointment tomorrow.  She denies nausea, vomiting, abdominal pain.   Past Medical History:  Diagnosis Date  . Allergy   . Asthma   . Bladder disorder   . Bladder infection   . Constipation   . Eczema   . Endometriosis   . Ovarian cyst   . UTI (lower urinary tract infection)     Patient Active Problem List   Diagnosis Date Noted  . Pelvic pain in female 11/26/2010  . Endometriosis 11/26/2010  . Chronic constipation 11/26/2010  . Cystocele 11/26/2010  . Rectocele 11/26/2010    Past Surgical History:  Procedure Laterality Date  . BLADDER SURGERY    . LAPAROSCOPY    . TUBAL LIGATION      Prior to Admission medications   Medication Sig Start Date End Date Taking? Authorizing Provider  amoxicillin (AMOXIL) 500 MG capsule Take 1 capsule (500 mg total) by mouth 3 (three) times daily. 05/07/17   Enid Derry, PA-C  buPROPion (WELLBUTRIN XL) 150 MG 24 hr tablet  08/12/16   [provider]  FLOVENT [facility] 220 MCG/ACT inhaler  08/12/16   [provider]  HYDROcodone-acetaminophen (NORCO/VICODIN) 5-325 MG per tablet Take 1 tablet by mouth every 6 (six) hours as needed. Not sure of exact dose, was received for dental work, is almost out, and only takes rarely    [provider]  hydrOXYzine (VISTARIL) 25 MG capsule  08/12/16   [provider]  ibuprofen (ADVIL,MOTRIN) 200 MG tablet Take 600 mg by mouth every 6 (six) hours as needed. pain    [provider]  lidocaine (XYLOCAINE) 2 % solution Use as directed 10 mLs in the mouth or throat as needed for mouth pain. 05/07/17   Enid Derry, PA-C  Melatonin 10 MG CAPS  08/12/16   [provider]  meloxicam (MOBIC) 15 MG tablet  08/12/16   [provider]  Multiple Vitamin (MULTIVITAMIN) tablet Take 1 tablet by mouth daily.     [provider]  norethindrone (AYGESTIN) 5 MG tablet Take 1 tablet (5 mg total) by mouth daily. 08/30/16   Conard Novak, MD  oxyCODONE-acetaminophen (ROXICET) 5-325 MG tablet Take 1 tablet by mouth every 6 (six) hours as needed. 05/07/17 05/07/18  Enid Derry, PA-C  PROAIR HFA 108 [phone]) MCG/ACT inhaler  08/12/16   [provider]  tiZANidine (ZANAFLEX) 4 MG tablet  08/12/16   [provider]  Anner Crete 27.5-15.6 MCG CAPS  08/15/16   [provider]  valACYclovir (VALTREX) 500 MG tablet  08/24/16   [provider]    Allergies Codeine and Sulfa antibiotics  Family History  Problem Relation Age of Onset  . Heart disease Father   . GER disease Father   . Hypertension Father   . GER disease Mother   . GER disease Brother   .  GER disease Sister     Social History Social History   Tobacco Use  . Smoking status: Current Every Day Smoker    Packs/day: 0.50    Years: 20.00    Pack years: 10.00    Types: Cigarettes  . Smokeless tobacco: Never Used  Substance Use Topics  . Alcohol use: Yes    Alcohol/week: 2.4 oz    Types: 2 Glasses of wine, 2 Cans of beer per week  . Drug use: No     Review of Systems  Constitutional: No fever/chills Cardiovascular: No chest pain. Respiratory: No SOB. Gastrointestinal: No abdominal pain.  No nausea, no vomiting.  Musculoskeletal: Negative for musculoskeletal pain. Skin: Negative for rash, abrasions, lacerations,  ecchymosis.  ____________________________________________   PHYSICAL EXAM:  VITAL SIGNS: ED Triage Vitals  Enc Vitals Group     BP 05/07/17 1348 (!) 145/79     Pulse Rate 05/07/17 1348 92     Resp 05/07/17 1348 16     Temp 05/07/17 1348 98.1 F (36.7 C)     Temp Source 05/07/17 1348 Oral     SpO2 05/07/17 1348 100 %     Weight 05/07/17 1349 170 lb (77.1 kg)     Height 05/07/17 1349 5\' 4"  (1.626 m)     Head Circumference --      Peak Flow --      Pain Score 05/07/17 1344 10     Pain Loc --      Pain Edu? --      Excl. in GC? --      Constitutional: Alert and oriented. Well appearing and in no acute distress. Eyes: Conjunctivae are normal. PERRL. EOMI. Head: Atraumatic. ENT:      Ears:      Nose: No congestion/rhinnorhea.      Mouth/Throat: Mucous membranes are moist.  Multiple dental cavities.  Tenderness to palpation around upper left canine and premolars.  No drainage from area.  No visible swelling. Neck: No stridor.  Cardiovascular: Good peripheral circulation. Respiratory: Normal respiratory effort without tachypnea or retractions. Good air entry to the bases with no decreased or absent breath sounds. Musculoskeletal: Full range of motion to all extremities. No gross deformities appreciated. Neurologic:  Normal speech and language. No gross focal neurologic deficits are appreciated.  Skin:  Skin is warm, dry and intact. No rash noted.   ____________________________________________   LABS (all labs ordered are listed, but only abnormal results are displayed)  Labs Reviewed - No data to display ____________________________________________  EKG   ____________________________________________  RADIOLOGY  No results found.  ____________________________________________    PROCEDURES  Procedure(s) performed:    Procedures    Medications - No data to display   ____________________________________________   INITIAL IMPRESSION / ASSESSMENT AND  PLAN / ED COURSE  Pertinent labs & imaging results that were available during my care of the patient were reviewed by me and considered in my medical decision making (see chart for details).  Review of the [place] CSRS was performed in accordance of the [facility] prior to dispensing any controlled drugs.   Patient's diagnosis is consistent with dental abscess.  Vital signs and exam are reassuring.  No drainable abscess.  Dental resources were provided.  Patient states that she is able to take oxycodone without any reaction or allergy.  Patient will be discharged home with prescriptions for amoxicillin, a short course of Percocet, and viscous lidocaine. Patient is to follow up with dentist as directed. Patient is given ED precautions  to return to the ED for any worsening or new symptoms.     ____________________________________________  FINAL CLINICAL IMPRESSION(S) / ED DIAGNOSES  Final diagnoses:  Dental abscess      NEW MEDICATIONS STARTED DURING THIS VISIT:  ED Discharge Orders        Ordered    oxyCODONE-acetaminophen (ROXICET) 5-325 MG tablet  Every 6 hours PRN,   Status:  Discontinued     05/07/17 1537    amoxicillin (AMOXIL) 500 MG capsule  3 times daily,   Status:  Discontinued     05/07/17 1537    lidocaine (XYLOCAINE) 2 % solution  As needed     05/07/17 1537    amoxicillin (AMOXIL) 500 MG capsule  3 times daily     05/07/17 1538    oxyCODONE-acetaminophen (ROXICET) 5-325 MG tablet  Every 6 hours PRN     05/07/17 1538          This chart was dictated using voice recognition software/Dragon. Despite best efforts to proofread, errors can occur which can change the meaning. Any change was purely unintentional.    Enid DerryWagner, Briona Korpela, PA-C 05/07/17 1649    Governor RooksLord, Rebecca, MD 05/11/17 [phone]

## 2017-05-07 NOTE — ED Notes (Signed)
Pt reports  "I have started taking Clindamycin that my momma had left over but I won't have enough - I am gonna call my dentist tomorrow but he will not see me till the infection is gone  - I need something for pain too"

## 2017-05-07 NOTE — ED Triage Notes (Signed)
Pt c/o dental pain x 2 days. Pt states that she thinks that her tooth is infected

## 2017-10-05 ENCOUNTER — Emergency Department: Payer: Medicaid Other

## 2017-10-05 ENCOUNTER — Emergency Department
Admission: EM | Admit: 2017-10-05 | Discharge: 2017-10-05 | Disposition: A | Discharge status: Home / Self Care | Payer: Medicaid Other | Attending: Emergency Medicine | Admitting: Emergency Medicine

## 2017-10-05 ENCOUNTER — Encounter: Payer: Self-pay | Admitting: Emergency Medicine

## 2017-10-05 DIAGNOSIS — J45909 Unspecified asthma, uncomplicated: Secondary | ICD-10-CM | POA: Insufficient documentation

## 2017-10-05 DIAGNOSIS — S8001XA Contusion of right knee, initial encounter: Secondary | ICD-10-CM | POA: Insufficient documentation

## 2017-10-05 DIAGNOSIS — F17210 Nicotine dependence, cigarettes, uncomplicated: Secondary | ICD-10-CM | POA: Insufficient documentation

## 2017-10-05 DIAGNOSIS — Y939 Activity, unspecified: Secondary | ICD-10-CM | POA: Insufficient documentation

## 2017-10-05 DIAGNOSIS — Y999 Unspecified external cause status: Secondary | ICD-10-CM | POA: Diagnosis not present

## 2017-10-05 DIAGNOSIS — Y929 Unspecified place or not applicable: Secondary | ICD-10-CM | POA: Diagnosis not present

## 2017-10-05 DIAGNOSIS — S8991XA Unspecified injury of right lower leg, initial encounter: Secondary | ICD-10-CM | POA: Diagnosis present

## 2017-10-05 DIAGNOSIS — Z79899 Other long term (current) drug therapy: Secondary | ICD-10-CM | POA: Diagnosis not present

## 2017-10-05 DIAGNOSIS — W228XXA Striking against or struck by other objects, initial encounter: Secondary | ICD-10-CM | POA: Diagnosis not present

## 2017-10-05 DIAGNOSIS — M25461 Effusion, right knee: Secondary | ICD-10-CM | POA: Diagnosis not present

## 2017-10-05 MED ORDER — PREDNISONE 10 MG PO TABS: ORAL_TABLET | ORAL | 0 refills | Status: DC

## 2017-10-05 NOTE — ED Triage Notes (Signed)
Patient presents to ED via POV from home with c/o right knee pain. Patient reports falling a month ago and injuring it. Patient states last night she hit it on a Financial plannertrailer hitch on a truck. Edema noted to right knee.

## 2017-10-05 NOTE — Discharge Instructions (Addendum)
Follow-up with your primary care provider if any continued problems or Dr. Odis LusterBowers who is the orthopedist on call today.  You will need to call make an appointment.  Begin taking prednisone tapering dose started with 60 mg and tapering down to 1 tablet.  Wear knee immobilizer when walking.  You do not need to wear it while sleeping.  Ice and elevation to reduce swelling.

## 2017-10-05 NOTE — ED Provider Notes (Signed)
[facility] Emergency Department Provider Note  ____________________________________________   First MD Initiated Contact with Patient 10/05/17 1135     (approximate)  I have reviewed the triage vital signs and the nursing notes.   HISTORY  Chief Complaint Knee Pain    HPI [name] is a 47 y.o. female is here with complaint of right knee pain.  Patient states she fell 1 month ago injuring her knee and then last night hit the trailer hitch on a truck causing even worse pain.  This morning she noticed more edema and has increased pain with walking.  Patient denies any previous injury to her knee.  With her initial injury she did not have x-rays done.  She rates her pain when walking as a 10/10.   Past Medical History:  Diagnosis Date  . Allergy   . Asthma   . Bladder disorder   . Bladder infection   . Constipation   . Eczema   . Endometriosis   . Ovarian cyst   . UTI (lower urinary tract infection)     Patient Active Problem List   Diagnosis Date Noted  . Pelvic pain in female 11/26/2010  . Endometriosis 11/26/2010  . Chronic constipation 11/26/2010  . Cystocele 11/26/2010  . Rectocele 11/26/2010    Past Surgical History:  Procedure Laterality Date  . BLADDER SURGERY    . LAPAROSCOPY    . TUBAL LIGATION      Prior to Admission medications   Medication Sig Start Date End Date Taking? Authorizing Provider  buPROPion (WELLBUTRIN XL) 150 MG 24 hr tablet  08/12/16   [provider]  FLOVENT [facility] 220 MCG/ACT inhaler  08/12/16   [provider]  hydrOXYzine (VISTARIL) 25 MG capsule  08/12/16   [provider]  Melatonin 10 MG CAPS  08/12/16   [provider]  meloxicam (MOBIC) 15 MG tablet  08/12/16   [provider]  Multiple Vitamin (MULTIVITAMIN) tablet Take 1 tablet by mouth daily.     [provider]  norethindrone (AYGESTIN) 5 MG tablet Take 1 tablet (5 mg total) by mouth daily.  08/30/16   Conard Novak, MD  oxyCODONE-acetaminophen (ROXICET) 5-325 MG tablet Take 1 tablet by mouth every 6 (six) hours as needed. 05/07/17 05/07/18  Enid Derry, PA-C  predniSONE (DELTASONE) 10 MG tablet Take 6 tablets  today, on day 2 take 5 tablets, day 3 take 4 tablets, day 4 take 3 tablets, day 5 take  2 tablets and 1 tablet the last day 10/05/17   Eather Colas  [facility] HFA 108 [phone]) MCG/ACT inhaler  08/12/16   [provider]  tiZANidine (ZANAFLEX) 4 MG tablet  08/12/16   [provider]  Anner Crete 27.5-15.6 MCG CAPS  08/15/16   [provider]  valACYclovir (VALTREX) 500 MG tablet  08/24/16   [provider]    Allergies Codeine and Sulfa antibiotics  Family History  Problem Relation Age of Onset  . Heart disease Father   . GER disease Father   . Hypertension Father   . GER disease Mother   . GER disease Brother   . GER disease Sister     Social History Social History   Tobacco Use  . Smoking status: Current Every Day Smoker    Packs/day: 0.50    Years: 20.00    Pack years: 10.00    Types: Cigarettes  . Smokeless tobacco: Never Used  Substance Use Topics  .  Alcohol use: Yes    Alcohol/week: 2.4 oz    Types: 2 Glasses of wine, 2 Cans of beer per week  . Drug use: No    Review of Systems Constitutional: No fever/chills Cardiovascular: Denies chest pain. Respiratory: Denies shortness of breath. Gastrointestinal:  No nausea, no vomiting.  Musculoskeletal: Positive for right knee pain. Skin: Negative for rash. Neurological: Negative for focal weakness or numbness. ____________________________________________   PHYSICAL EXAM:  VITAL SIGNS: ED Triage Vitals  Enc Vitals Group     BP      Pulse      Resp      Temp      Temp src      SpO2      Weight      Height      Head Circumference      Peak Flow      Pain Score      Pain Loc      Pain Edu?      Excl. in GC?    Constitutional: Alert and  oriented. Well appearing and in no acute distress. Eyes: Conjunctivae are normal.  Head: Atraumatic. Neck: No stridor.   Cardiovascular: Normal rate, regular rhythm. Grossly normal heart sounds.  Good peripheral circulation. Respiratory: Normal respiratory effort.  No retractions. Lungs CTAB. Musculoskeletal: Examination of the right knee there is soft tissue swelling but no effusion is obvious.  Range of motion with crepitus and limited secondary to pain and edema.  No erythema or abrasions were seen.  There is a small ecchymotic area just above the patella.  Skin is intact.  Ligaments are stable on the medial aspect but with minimal movement on stress of lateral aspect.  No edema or abrasions are noted distal to this area. Neurologic:  Normal speech and language. No gross focal neurologic deficits are appreciated.  Skin:  Skin is warm, dry and intact. No rash noted. Psychiatric: Mood and affect are normal. Speech and behavior are normal.  ____________________________________________   LABS (all labs ordered are listed, but only abnormal results are displayed)  Labs Reviewed - No data to display  RADIOLOGY  ED MD interpretation:   Right knee x-ray is negative for fracture.  Small joint effusion present.  Official radiology report(s): Dg Knee Complete 4 Views Right  Result Date: 10/05/2017 CLINICAL DATA:  Patient hit right knee on bed of truck, bruising and pain. No hx of fx. EXAM: RIGHT KNEE - COMPLETE 4+ VIEW COMPARISON:  None. FINDINGS: No acute fracture or subluxation. Moderate joint effusion is present. No radiopaque foreign body or soft tissue gas. IMPRESSION: Joint effusion. Electronically Signed   By: Norva PavlovElizabeth  Brown M.D.   On: 10/05/2017 12:41    ____________________________________________   PROCEDURES  Procedure(s) performed:   .Splint Application Date/Time: 10/05/2017 4:06 PM Performed by: Suezanne Jacqueteer, Thomas B, NT Authorized by: Tommi RumpsSummers, Vennesa Bastedo L, PA-C   Consent:     Consent obtained:  Verbal   Consent given by:  Patient   Risks discussed:  Pain and swelling   Alternatives discussed:  Referral Pre-procedure details:    Sensation:  Normal Procedure details:    Laterality:  Right   Location:  Knee   Splint type:  Knee immobilizer Post-procedure details:    Pain:  Unchanged   Sensation:  Normal   Patient tolerance of procedure:  Tolerated well, no immediate complications    Critical Care performed: No  ____________________________________________   INITIAL IMPRESSION / ASSESSMENT AND PLAN / ED COURSE  As part  of my medical decision making, I reviewed the following data within the electronic MEDICAL RECORD NUMBER Notes from prior ED visits and [place] Controlled Substance Database  Patient is here with complaint of right knee pain.  X-rays were reassuring for no fracture but small knee effusion was discussed with patient.  She was placed in a knee immobilizer and to follow-up with her PCP or Dr. Odis Luster who is on-call for orthopedics.  Patient was given a prescription for prednisone to begin taking today.   FINAL CLINICAL IMPRESSION(S) / ED DIAGNOSES  Final diagnoses:  Effusion of right knee  Contusion of right knee, initial encounter     ED Discharge Orders        Ordered    predniSONE (DELTASONE) 10 MG tablet     10/05/17 1316       Note:  This document was prepared using Dragon voice recognition software and may include unintentional dictation errors.    Tommi Rumps, PA-C 10/05/17 1611    Emily Filbert, MD 10/14/17 [phone]

## 2017-11-28 ENCOUNTER — Other Ambulatory Visit: Payer: Self-pay

## 2017-11-28 ENCOUNTER — Emergency Department
Admission: EM | Admit: 2017-11-28 | Discharge: 2017-11-28 | Disposition: A | Discharge status: Home / Self Care | Payer: Medicaid Other | Attending: Emergency Medicine | Admitting: Emergency Medicine

## 2017-11-28 ENCOUNTER — Encounter: Payer: Self-pay | Admitting: Emergency Medicine

## 2017-11-28 ENCOUNTER — Emergency Department: Payer: Medicaid Other

## 2017-11-28 DIAGNOSIS — R609 Edema, unspecified: Secondary | ICD-10-CM

## 2017-11-28 DIAGNOSIS — J45909 Unspecified asthma, uncomplicated: Secondary | ICD-10-CM | POA: Diagnosis not present

## 2017-11-28 DIAGNOSIS — R600 Localized edema: Secondary | ICD-10-CM | POA: Insufficient documentation

## 2017-11-28 DIAGNOSIS — R2240 Localized swelling, mass and lump, unspecified lower limb: Secondary | ICD-10-CM | POA: Diagnosis not present

## 2017-11-28 DIAGNOSIS — R079 Chest pain, unspecified: Secondary | ICD-10-CM | POA: Diagnosis present

## 2017-11-28 DIAGNOSIS — F17210 Nicotine dependence, cigarettes, uncomplicated: Secondary | ICD-10-CM | POA: Diagnosis not present

## 2017-11-28 LAB — CBC
HEMATOCRIT: 37.7 % (ref 35.0–47.0)
HEMOGLOBIN: 13.4 g/dL (ref 12.0–16.0)
MCH: 35.4 pg — ABNORMAL HIGH (ref 26.0–34.0)
MCHC: 35.5 g/dL (ref 32.0–36.0)
MCV: 99.8 fL (ref 80.0–100.0)
Platelets: 258 10*3/uL (ref 150–440)
RBC: 3.78 MIL/uL — ABNORMAL LOW (ref 3.80–5.20)
RDW: 13.8 % (ref 11.5–14.5)
WBC: 9.6 10*3/uL (ref 3.6–11.0)

## 2017-11-28 LAB — BASIC METABOLIC PANEL
ANION GAP: 5 (ref 5–15)
BUN: 9 mg/dL (ref 6–20)
CO2: 26 mmol/L (ref 22–32)
Calcium: 8.7 mg/dL — ABNORMAL LOW (ref 8.9–10.3)
Chloride: 108 mmol/L (ref 98–111)
Creatinine, Ser: 0.66 mg/dL (ref 0.44–1.00)
GFR calc non Af Amer: >60 mL/min (ref >60)
Glucose, Bld: 98 mg/dL (ref 70–99)
POTASSIUM: 3.7 mmol/L (ref 3.5–5.1)
Sodium: 139 mmol/L (ref 135–145)

## 2017-11-28 LAB — TROPONIN I
Troponin I: <0.03 ng/mL (ref <0.03)
Troponin I: <0.03 ng/mL (ref <0.03)

## 2017-11-28 MED ORDER — FUROSEMIDE 20 MG PO TABS: 20.0000 mg | ORAL_TABLET | Freq: Every day | ORAL | 0 refills | Status: DC

## 2017-11-28 NOTE — ED Notes (Signed)
Pt given a cup of water 

## 2017-11-28 NOTE — ED Triage Notes (Signed)
Pt via ems from home with chest pain and dizziness that started several weeks ago, worsening this morning. Her legs began swelling last night as well. Pt alert & oriented with NAD noted.

## 2017-11-28 NOTE — ED Provider Notes (Signed)
[facility] Emergency Department Provider Note       Time seen: ----------------------------------------- 5:07 PM on 11/28/2017 -----------------------------------------   I have reviewed the triage vital signs and the nursing notes.  HISTORY   Chief Complaint Chest Pain and Leg Swelling    HPI [name] is a 47 y.o. female with a history of allergies, asthma, constipation, endometriosis who presents to the ED for chest pain and dizziness that started several weeks ago that worsened this morning.  She is also complaining of leg swelling last night as well.  Typically her left leg is more swollen than the right leg.  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  She denies fevers, chills or other complaints.  Past Medical History:  Diagnosis Date  . Allergy   . Asthma   . Bladder disorder   . Bladder infection   . Constipation   . Eczema   . Endometriosis   . Ovarian cyst   . UTI (lower urinary tract infection)     Patient Active Problem List   Diagnosis Date Noted  . Pelvic pain in female 11/26/2010  . Endometriosis 11/26/2010  . Chronic constipation 11/26/2010  . Cystocele 11/26/2010  . Rectocele 11/26/2010    Past Surgical History:  Procedure Laterality Date  . BLADDER SURGERY    . LAPAROSCOPY    . TUBAL LIGATION      Allergies Codeine and Sulfa antibiotics  Social History Social History   Tobacco Use  . Smoking status: Current Every Day Smoker    Packs/day: 0.50    Years: 20.00    Pack years: 10.00    Types: Cigarettes  . Smokeless tobacco: Never Used  Substance Use Topics  . Alcohol use: Yes    Alcohol/week: 2.4 oz    Types: 2 Glasses of wine, 2 Cans of beer per week    Comment: 3 x per week  . Drug use: No   Review of Systems Constitutional: Negative for fever. Cardiovascular: Positive for chest pain Respiratory: Negative for shortness of breath. Gastrointestinal: Negative for  abdominal pain, vomiting and diarrhea. Musculoskeletal: Negative for back pain.  Positive for peripheral edema Skin: Negative for rash. Neurological: Negative for headaches, focal weakness or numbness.  All systems negative/normal/unremarkable except as stated in the HPI  ____________________________________________   PHYSICAL EXAM:  VITAL SIGNS: ED Triage Vitals  Enc Vitals Group     BP 11/28/17 1336 112/70     Pulse Rate 11/28/17 1336 66     Resp 11/28/17 1336 16     Temp 11/28/17 1336 98.1 F (36.7 C)     Temp Source 11/28/17 1336 Oral     SpO2 11/28/17 1336 100 %     Weight 11/28/17 1337 160 lb (72.6 kg)     Height 11/28/17 1337 5\' 4"  (1.626 m)     Head Circumference --      Peak Flow --      Pain Score 11/28/17 1337 8     Pain Loc --      Pain Edu? --      Excl. in GC? --    Constitutional: Alert and oriented. Well appearing and in no distress. Eyes: Conjunctivae are normal. Normal extraocular movements. ENT   Head: Normocephalic and atraumatic.   Nose: No congestion/rhinnorhea.   Mouth/Throat: Mucous membranes are moist.   Neck: No stridor. Cardiovascular: Normal rate, regular rhythm. No murmurs, rubs, or gallops. Respiratory: Normal respiratory effort without tachypnea nor  retractions. Breath sounds are clear and equal bilaterally. No wheezes/rales/rhonchi. Gastrointestinal: Soft and nontender. Normal bowel sounds Musculoskeletal: Nontender with normal range of motion in extremities. No lower extremity tenderness nor edema. Neurologic:  Normal speech and language. No gross focal neurologic deficits are appreciated.  Skin:  Skin is warm, dry and intact. No rash noted. Psychiatric: Mood and affect are normal. Speech and behavior are normal.  ____________________________________________  EKG: Interpreted by me.  Sinus rhythm rate 67 bpm, normal QRS, normal QT, normal axis.  ____________________________________________  ED COURSE:  As part of my  medical decision making, I reviewed the following data within the electronic MEDICAL RECORD NUMBER History obtained from family if available, nursing notes, old chart and ekg, as well as notes from prior ED visits. Patient presented for multiple complaints but mainly peripheral edema, we will assess with labs and imaging as indicated at this time.   Procedures ____________________________________________   LABS (pertinent positives/negatives)  Labs Reviewed  BASIC METABOLIC PANEL - Abnormal; Notable for the following components:      Result Value   Calcium 8.7 (*)    All other components within normal limits  CBC - Abnormal; Notable for the following components:   RBC 3.78 (*)    MCH 35.4 (*)    All other components within normal limits  TROPONIN I  TROPONIN I  POC URINE PREG, ED    RADIOLOGY Chest x-ray  IMPRESSION: Mild chronic bronchitic-smoking related changes. No pneumonia, CHF, nor other acute cardiopulmonary abnormality.  ____________________________________________  DIFFERENTIAL DIAGNOSIS   Peripheral edema, DVT, renal failure, CHF, unstable angina unlikely  FINAL ASSESSMENT AND PLAN  Peripheral edema   Plan: The patient had presented for chest pain which is nonspecific as well as peripheral edema which appears to be benign in origin. Patient's labs are reassuring. Patient's imaging was unremarkable as well.  I advised wearing compression stockings as well as a low-dose Lasix.  She is cleared for outpatient follow-up.   Ulice DashJohnathan E Amari Burnsworth, MD   Note: This note was generated in part or whole with voice recognition software. Voice recognition is usually quite accurate but there are transcription errors that can and very often do occur. I apologize for any typographical errors that were not detected and corrected.     Emily FilbertWilliams, Jerran Tappan E, MD 11/28/17 1710

## 2018-06-25 ENCOUNTER — Emergency Department (HOSPITAL_COMMUNITY)
Admission: EM | Admit: 2018-06-25 | Discharge: 2018-06-25 | Disposition: A | Discharge status: Home / Self Care | Payer: Medicaid Other | Attending: Emergency Medicine | Admitting: Emergency Medicine

## 2018-06-25 ENCOUNTER — Emergency Department (HOSPITAL_COMMUNITY): Payer: Medicaid Other

## 2018-06-25 ENCOUNTER — Encounter (HOSPITAL_COMMUNITY): Payer: Self-pay

## 2018-06-25 DIAGNOSIS — M25522 Pain in left elbow: Secondary | ICD-10-CM

## 2018-06-25 DIAGNOSIS — Z79899 Other long term (current) drug therapy: Secondary | ICD-10-CM | POA: Insufficient documentation

## 2018-06-25 DIAGNOSIS — F17210 Nicotine dependence, cigarettes, uncomplicated: Secondary | ICD-10-CM | POA: Diagnosis not present

## 2018-06-25 DIAGNOSIS — J45909 Unspecified asthma, uncomplicated: Secondary | ICD-10-CM | POA: Diagnosis not present

## 2018-06-25 NOTE — ED Triage Notes (Signed)
Pt presents for evaluation of L arm pain in elbow and upper arm starting last night after "rough housing" with family.

## 2018-06-25 NOTE — ED Provider Notes (Signed)
[facility] EMERGENCY DEPARTMENT Provider Note   CSN: [id number] Arrival date & time: 06/25/18  1200    History   Chief Complaint Chief Complaint  Patient presents with  . Arm Pain    HPI [name] is a 48 y.o. female.     HPI   48 year old female presents today with complaints of left elbow pain.  Patient notes she was roughhousing with her nieces yesterday.  Uncertain mechanism.  She notes pain to the elbow diffusely.  Decreased range of motion secondary to pain.  No distal neurological deficits.  No medications prior to arrival.  No history of the same.  No other injuries to note.  Past Medical History:  Diagnosis Date  . Allergy   . Asthma   . Bladder disorder   . Bladder infection   . Constipation   . Eczema   . Endometriosis   . Ovarian cyst   . UTI (lower urinary tract infection)     Patient Active Problem List   Diagnosis Date Noted  . Pelvic pain in female 11/26/2010  . Endometriosis 11/26/2010  . Chronic constipation 11/26/2010  . Cystocele 11/26/2010  . Rectocele 11/26/2010    Past Surgical History:  Procedure Laterality Date  . BLADDER SURGERY    . LAPAROSCOPY    . TUBAL LIGATION       OB History    Gravida  4   Para  3   Term  3   Preterm      AB  1   Living  2     SAB  0   TAB  1   Ectopic      Multiple      Live Births               Home Medications    Prior to Admission medications   Medication Sig Start Date End Date Taking? Authorizing Provider  buPROPion (WELLBUTRIN XL) 150 MG 24 hr tablet  08/12/16   [provider]  FLOVENT HFA 220 MCG/ACT inhaler  08/12/16   [provider]  furosemide (LASIX) 20 MG tablet Take 1 tablet (20 mg total) by mouth daily for 7 days. 11/28/17 12/05/17  Emily Filbert, MD  hydrOXYzine (VISTARIL) 25 MG capsule  08/12/16   [provider]  Melatonin 10 MG CAPS  08/12/16   [provider]  meloxicam (MOBIC) 15 MG tablet   08/12/16   [provider]  Multiple Vitamin (MULTIVITAMIN) tablet Take 1 tablet by mouth daily.     [provider]  norethindrone (AYGESTIN) 5 MG tablet Take 1 tablet (5 mg total) by mouth daily. 08/30/16   Conard Novak, MD  predniSONE (DELTASONE) 10 MG tablet Take 6 tablets  today, on day 2 take 5 tablets, day 3 take 4 tablets, day 4 take 3 tablets, day 5 take  2 tablets and 1 tablet the last day 10/05/17   Eather Colas  [facility] HFA 108 [phone]) MCG/ACT inhaler  08/12/16   [provider]  tiZANidine (ZANAFLEX) 4 MG tablet  08/12/16   [provider]  Anner Crete 27.5-15.6 MCG CAPS  08/15/16   [provider]  valACYclovir (VALTREX) 500 MG tablet  08/24/16   [provider]    Family History Family History  Problem Relation Age of Onset  . Heart disease Father   . GER disease Father   . Hypertension Father   . GER disease Mother   .  GER disease Brother   . GER disease Sister     Social History Social History   Tobacco Use  . Smoking status: Current Every Day Smoker    Packs/day: 0.50    Years: 20.00    Pack years: 10.00    Types: Cigarettes  . Smokeless tobacco: Never Used  Substance Use Topics  . Alcohol use: Yes    Alcohol/week: 4.0 standard drinks    Types: 2 Glasses of wine, 2 Cans of beer per week    Comment: 3 x per week  . Drug use: No     Allergies   Codeine and Sulfa antibiotics   Review of Systems Review of Systems  All other systems reviewed and are negative.   Physical Exam Updated Vital Signs BP 136/80 (BP Location: Right Arm)   Pulse 85   Temp 98 F (36.7 C) (Oral)   Resp 16   SpO2 98%   Physical Exam Vitals signs and nursing note reviewed.  Constitutional:      Appearance: She is well-developed.  HENT:     Head: Normocephalic and atraumatic.  Eyes:     General: No scleral icterus.       Right eye: No discharge.        Left eye: No discharge.     Conjunctiva/sclera:  Conjunctivae normal.     Pupils: Pupils are equal, round, and reactive to light.  Neck:     Musculoskeletal: Normal range of motion.     Vascular: No JVD.     Trachea: No tracheal deviation.  Pulmonary:     Effort: Pulmonary effort is normal.     Breath sounds: No stridor.  Musculoskeletal:     Comments: Left elbow is atraumatic no swelling or edema, generalized tenderness, nonfocal, near full extension, pain with nation and supination/worse with supination   Neurological:     Mental Status: She is alert and oriented to person, place, and time.     Coordination: Coordination normal.  Psychiatric:        Behavior: Behavior normal.        Thought Content: Thought content normal.        Judgment: Judgment normal.      ED Treatments / Results  Labs (all labs ordered are listed, but only abnormal results are displayed) Labs Reviewed - No data to display  EKG None  Radiology Dg Elbow Complete Left  Result Date: 06/25/2018 CLINICAL DATA:  Left arm pain following wrestling, initial encounter EXAM: LEFT ELBOW - COMPLETE 3+ VIEW COMPARISON:  None. FINDINGS: There is no evidence of fracture, dislocation, or joint effusion. There is no evidence of arthropathy or other focal bone abnormality. Soft tissues are unremarkable. IMPRESSION: No acute abnormality Electronically Signed   By: Alcide Clever M.D.   On: 06/25/2018 13:06    Procedures Procedures (including critical care time)  Medications Ordered in ED Medications - No data to display   Initial Impression / Assessment and Plan / ED Course  I have reviewed the triage vital signs and the nursing notes.  Pertinent labs & imaging results that were available during my care of the patient were reviewed by me and considered in my medical decision making (see chart for details).        Labs:   Imaging:  Consults:  Therapeutics:  Discharge Meds:   Assessment/Plan: 48 year old female presents today with complaints of left  elbow pain.  She is atraumatic, has reassuring x-ray.  Low suspicion for occult fracture.  Discharged with sling, encouragement to mobilize early and follow-up with orthopedist if symptoms do not improve.   Final Clinical Impressions(s) / ED Diagnoses   Final diagnoses:  Left elbow pain    ED Discharge Orders    None       Eyvonne Mechanic, PA-C 06/25/18 1337    Geoffery Lyons, MD 06/25/18 [phone]

## 2018-06-25 NOTE — ED Notes (Signed)
Pt verbalized understanding of discharge paperwork and follow-up care.  °

## 2018-06-25 NOTE — ED Notes (Signed)
Patient transported to X-ray 

## 2018-06-25 NOTE — Discharge Instructions (Signed)
Please read attached information. If you experience any new or worsening signs or symptoms please return to the emergency room for evaluation. Please follow-up with your primary care provider or specialist as discussed.  °

## 2018-07-03 ENCOUNTER — Encounter: Payer: Medicaid Other | Admitting: Obstetrics & Gynecology

## 2018-07-03 NOTE — Progress Notes (Deleted)
   Patient did not show up today for her scheduled appointment.   Natali Lavallee, MD, FACOG Obstetrician & Gynecologist, [facility], [facility]  

## 2019-04-11 ENCOUNTER — Encounter: Payer: Medicaid Other | Admitting: Obstetrics and Gynecology

## 2019-05-09 ENCOUNTER — Other Ambulatory Visit: Payer: Self-pay

## 2019-05-09 ENCOUNTER — Other Ambulatory Visit: Payer: Self-pay | Admitting: Obstetrics and Gynecology

## 2019-05-09 ENCOUNTER — Ambulatory Visit (INDEPENDENT_AMBULATORY_CARE_PROVIDER_SITE_OTHER): Payer: Medicaid Other | Admitting: Obstetrics and Gynecology

## 2019-05-09 ENCOUNTER — Other Ambulatory Visit (HOSPITAL_COMMUNITY)
Admission: RE | Admit: 2019-05-09 | Discharge: 2019-05-09 | Disposition: A | Discharge status: Home / Self Care | Payer: Medicaid Other | Source: Ambulatory Visit | Attending: Obstetrics and Gynecology | Admitting: Obstetrics and Gynecology

## 2019-05-09 ENCOUNTER — Encounter: Payer: Self-pay | Admitting: Obstetrics and Gynecology

## 2019-05-09 VITALS — BP 127/85 | HR 72 | Wt 207.0 lb

## 2019-05-09 DIAGNOSIS — M545 Low back pain, unspecified: Secondary | ICD-10-CM

## 2019-05-09 DIAGNOSIS — Z0000 Encounter for general adult medical examination without abnormal findings: Secondary | ICD-10-CM

## 2019-05-09 DIAGNOSIS — Z3202 Encounter for pregnancy test, result negative: Secondary | ICD-10-CM | POA: Diagnosis not present

## 2019-05-09 DIAGNOSIS — G8929 Other chronic pain: Secondary | ICD-10-CM

## 2019-05-09 DIAGNOSIS — Z01419 Encounter for gynecological examination (general) (routine) without abnormal findings: Secondary | ICD-10-CM | POA: Diagnosis present

## 2019-05-09 DIAGNOSIS — R102 Pelvic and perineal pain: Secondary | ICD-10-CM

## 2019-05-09 DIAGNOSIS — Z720 Tobacco use: Secondary | ICD-10-CM

## 2019-05-09 DIAGNOSIS — N912 Amenorrhea, unspecified: Secondary | ICD-10-CM

## 2019-05-09 LAB — POCT URINE PREGNANCY: Preg Test, Ur: NEGATIVE

## 2019-05-09 NOTE — Progress Notes (Signed)
Back pain and stomach pain for several months. No cycle in 2-3 months.  Sti testing today Mammogram needed

## 2019-05-10 LAB — COMPREHENSIVE METABOLIC PANEL
ALT: 14 IU/L (ref 0–32)
AST: 12 IU/L (ref 0–40)
Albumin/Globulin Ratio: 1.6 (ref 1.2–2.2)
Albumin: 4.1 g/dL (ref 3.8–4.8)
Alkaline Phosphatase: 66 IU/L (ref 39–117)
BUN/Creatinine Ratio: 23 (ref 9–23)
BUN: 13 mg/dL (ref 6–24)
Bilirubin Total: 0.2 mg/dL (ref 0.0–1.2)
CO2: 20 mmol/L (ref 20–29)
Calcium: 8.8 mg/dL (ref 8.7–10.2)
Chloride: 104 mmol/L (ref 96–106)
Creatinine, Ser: 0.56 mg/dL — ABNORMAL LOW (ref 0.57–1.00)
GFR calc Af Amer: 127 mL/min/{1.73_m2} (ref >59)
GFR calc non Af Amer: 110 mL/min/{1.73_m2} (ref >59)
Globulin, Total: 2.5 g/dL (ref 1.5–4.5)
Glucose: 107 mg/dL — ABNORMAL HIGH (ref 65–99)
Potassium: 4 mmol/L (ref 3.5–5.2)
Sodium: 139 mmol/L (ref 134–144)
Total Protein: 6.6 g/dL (ref 6.0–8.5)

## 2019-05-10 LAB — PROLACTIN: Prolactin: 14.9 ng/mL (ref 4.8–23.3)

## 2019-05-10 LAB — BETA HCG QUANT (REF LAB): hCG Quant: <1 m[IU]/mL

## 2019-05-10 LAB — ESTRADIOL: Estradiol: 155 pg/mL

## 2019-05-10 LAB — URINE CULTURE: Organism ID, Bacteria: NO GROWTH

## 2019-05-10 LAB — FOLLICLE STIMULATING HORMONE: FSH: 13.4 m[IU]/mL

## 2019-05-10 LAB — TSH: TSH: 0.832 u[IU]/mL (ref 0.450–4.500)

## 2019-05-13 LAB — CERVICOVAGINAL ANCILLARY ONLY
Bacterial Vaginitis (gardnerella): NEGATIVE
Candida Glabrata: NEGATIVE
Candida Vaginitis: NEGATIVE
Comment: NEGATIVE
Comment: NEGATIVE
Comment: NEGATIVE

## 2019-05-13 NOTE — Progress Notes (Signed)
Obstetrics and Gynecology Annual Patient Evaluation  Appointment Date: 05/09/2019  OBGYN Clinic: [facility]  Primary Care Provider: Evelene Croon   Chief Complaint:  Chief Complaint  Patient presents with  . Gynecologic Exam    History of Present Illness: [name] is a 49 y.o. Caucasian [id number] (LMP October 2020), seen for the above chief complaint. Her past medical history is significant for endometriosis, BTL, some kind of bladder surgery, vag delivery x 3, tobacco abuse  GYN: +vaginal swelling and dysuria and some low back pain that occurs a few times a day. Having some vaginal dryness and what sounds like hot flashes and night sweats.    No breast s/s, fevers, chills, chest pain, SOB, nausea, vomiting, abdominal pain, vaginal discharge, hematuria, vaginal itching, dyspareunia, diarrhea, constipation, blood in BMs  Review of Systems: Pertinent items noted in HPI and remainder of comprehensive ROS otherwise negative.   Patient Active Problem List   Diagnosis Date Noted  . Fatigue 04/11/2012  . GERD (gastroesophageal reflux disease) 04/05/2012  . Nasal congestion 04/05/2012  . Rash 04/05/2012  . Right shoulder injury 04/05/2012  . Shoulder pain 04/05/2012  . Dysuria 03/21/2012  . Painful bladder spasm 03/21/2012  . Pelvic pain in female 11/26/2010  . Endometriosis 11/26/2010  . Constipation 11/26/2010  . Cystocele 11/26/2010  . Rectocele 11/26/2010     Past Medical History:  Past Medical History:  Diagnosis Date  . Allergy   . Asthma   . Bladder disorder   . Bladder infection   . Constipation   . Eczema   . Endometriosis   . Ovarian cyst   . UTI (lower urinary tract infection)     Past Surgical History:  Past Surgical History:  Procedure Laterality Date  . BLADDER SURGERY    . LAPAROSCOPY    . TUBAL LIGATION      Past Obstetrical History:  OB History  Gravida Para Term Preterm AB Living  4 3 3   1 2   SAB  TAB Ectopic Multiple Live Births  0 1          # Outcome Date GA Lbr Len/2nd Weight Sex Delivery Anes PTL Lv  4 Term 2004    F Vag-Spont     3 Term 2002 [redacted]w[redacted]d   M Vag-Spont        Birth Comments: IUFD  2 TAB 1995          1 Term 42 [redacted]w[redacted]d   F Vag-Spont       Past Gynecological History: As per HPI. Periods: patient states that period usually lasts 2-3d and last one was back in October and lasted for about a day History of Pap Smear(s): Yes.   Last pap unknown She is currently using bilateral tubal ligation for contraception.   Social History:  Social History   Socioeconomic History  . Marital status: Legally Separated    Spouse name: Not on file  . Number of children: Not on file  . Years of education: Not on file  . Highest education level: Not on file  Occupational History  . Not on file  Tobacco Use  . Smoking status: Current Every Day Smoker    Packs/day: 0.50    Years: 20.00    Pack years: 10.00    Types: Cigarettes  . Smokeless tobacco: Never Used  Substance and Sexual Activity  . Alcohol use: Yes    Alcohol/week: 4.0 standard drinks    Types: 2 Glasses of wine,  2 Cans of beer per week    Comment: 3 x per week  . Drug use: No  . Sexual activity: Not Currently    Birth control/protection: Surgical  Other Topics Concern  . Not on file  Social History Narrative  . Not on file   Social Determinants of Health   Financial Resource Strain:   . Difficulty of Paying Living Expenses: Not on file  Food Insecurity:   . Worried About Programme researcher, broadcasting/film/video in the Last Year: Not on file  . Ran Out of Food in the Last Year: Not on file  Transportation Needs:   . Lack of Transportation (Medical): Not on file  . Lack of Transportation (Non-Medical): Not on file  Physical Activity:   . Days of Exercise per Week: Not on file  . Minutes of Exercise per Session: Not on file  Stress:   . Feeling of Stress : Not on file  Social Connections:   . Frequency of Communication  with Friends and Family: Not on file  . Frequency of Social Gatherings with Friends and Family: Not on file  . Attends Religious Services: Not on file  . Active Member of Clubs or Organizations: Not on file  . Attends Banker Meetings: Not on file  . Marital Status: Not on file  Intimate Partner Violence:   . Fear of Current or Ex-Partner: Not on file  . Emotionally Abused: Not on file  . Physically Abused: Not on file  . Sexually Abused: Not on file    Family History:  Family History  Problem Relation Age of Onset  . Heart disease Father   . GER disease Father   . Hypertension Father   . GER disease Mother   . GER disease Brother   . GER disease Sister     Medications [name] had no medications administered during this visit. Current Outpatient Medications  Medication Sig Dispense Refill  . buPROPion (WELLBUTRIN XL) 150 MG 24 hr tablet   2  . FLOVENT HFA 220 MCG/ACT inhaler   5  . furosemide (LASIX) 20 MG tablet Take 1 tablet (20 mg total) by mouth daily for 7 days. 7 tablet 0  . hydrOXYzine (VISTARIL) 25 MG capsule   5  . Melatonin 10 MG CAPS   5  . meloxicam (MOBIC) 15 MG tablet   5  . Multiple Vitamin (MULTIVITAMIN) tablet Take 1 tablet by mouth daily.     . norethindrone (AYGESTIN) 5 MG tablet Take 1 tablet (5 mg total) by mouth daily. (Patient not taking: Reported on 05/09/2019) 30 tablet 2  . predniSONE (DELTASONE) 10 MG tablet Take 6 tablets  today, on day 2 take 5 tablets, day 3 take 4 tablets, day 4 take 3 tablets, day 5 take  2 tablets and 1 tablet the last day (Patient not taking: Reported on 05/09/2019) 21 tablet 0  . PROAIR HFA 108 (90 Base) MCG/ACT inhaler   5  . tiZANidine (ZANAFLEX) 4 MG tablet   5  . UTIBRON NEOHALER 27.5-15.6 MCG CAPS   5  . valACYclovir (VALTREX) 500 MG tablet   5   No current facility-administered medications for this visit.    Allergies Codeine, Penicillins, Sulfa antibiotics, Sulfur, and  Sulfamethoxazole   Physical Exam:  BP 127/85   Pulse 72   Wt 207 lb (93.9 kg)   LMP 01/31/2019 (LMP Unknown)   BMI 35.53 kg/m  Body mass index is 35.53 kg/m. General appearance: Well nourished,  well developed female in no acute distress.  Neck:  Supple, normal appearance, and no thyromegaly  Cardiovascular: normal s1 and s2.  No murmurs, rubs or gallops. Respiratory:  Clear to auscultation bilateral. Normal respiratory effort Abdomen: positive bowel sounds and no masses, hernias; diffusely non tender to palpation, non distended Breasts: breasts appear normal, no suspicious masses, no skin or nipple changes or axillary nodes, and normal palpation. Neuro/Psych:  Normal mood and affect.  Skin:  Warm and dry.  Lymphatic:  No inguinal lymphadenopathy.   Pelvic exam: is not limited by body habitus EGBUS: within normal limits, Vagina: within normal limits and with no blood or discharge in the vault, Cervix: normal appearing cervix without tenderness, discharge or lesions. Uterus:  nonenlarged and non tender and Adnexa:  normal adnexa and no mass, fullness, tenderness Rectovaginal: deferred  Laboratory: none  Radiology: none  Assessment: pt stable  Plan: 1. Tobacco abuse  2. Amenorrhea Likely due to perimenopause. Basic lab work up - TSH - Follicle stimulating hormone - Estradiol - BHCG, Quant*LC - Prolactin - POC Urine Pregnancy (Dx code Z32.02)  3. Women's annual routine gynecological examination - Cytology - PAP( [place]) - Cervicovaginal ancillary only( [place]) - TSH - Follicle stimulating hormone - Estradiol - BHCG, Quant*LC - Prolactin - US PELVIC COMPLETE WITH TRANSVAGINAL; Future - Urine Culture-GYN - CMP  4. Pelvic pain - US PELVIC COMPLETE WITH TRANSVAGINAL; Future - Urine Culture-GYN - CMP  5. Chronic midline low back pain, unspecified whether sciatica present - US PELVIC COMPLETE WITH TRANSVAGINAL; Future  RTC follow up with patient after  u/s  Durene Romans MD Attending Center [facility])

## 2019-05-14 LAB — CYTOLOGY - PAP
Chlamydia: NEGATIVE
Comment: NEGATIVE
Comment: NEGATIVE
Comment: NEGATIVE
Comment: NORMAL
Diagnosis: NEGATIVE
High risk HPV: NEGATIVE
Neisseria Gonorrhea: NEGATIVE
Trichomonas: NEGATIVE

## 2019-05-17 ENCOUNTER — Ambulatory Visit: Payer: Medicaid Other

## 2020-03-11 ENCOUNTER — Encounter: Payer: Self-pay | Admitting: Radiology

## 2020-05-20 ENCOUNTER — Other Ambulatory Visit: Payer: Self-pay | Admitting: Student

## 2020-05-20 DIAGNOSIS — R1031 Right lower quadrant pain: Secondary | ICD-10-CM

## 2020-05-20 DIAGNOSIS — R102 Pelvic and perineal pain: Secondary | ICD-10-CM

## 2020-05-20 DIAGNOSIS — R1032 Left lower quadrant pain: Secondary | ICD-10-CM

## 2020-05-21 ENCOUNTER — Other Ambulatory Visit: Payer: Self-pay

## 2020-05-21 ENCOUNTER — Ambulatory Visit
Admission: RE | Admit: 2020-05-21 | Discharge: 2020-05-21 | Disposition: A | Discharge status: Home / Self Care | Payer: Medicaid Other | Source: Ambulatory Visit | Attending: Student | Admitting: Student

## 2020-05-21 DIAGNOSIS — R102 Pelvic and perineal pain: Secondary | ICD-10-CM | POA: Diagnosis present

## 2020-05-21 DIAGNOSIS — R1032 Left lower quadrant pain: Secondary | ICD-10-CM | POA: Diagnosis present

## 2020-05-21 DIAGNOSIS — R1031 Right lower quadrant pain: Secondary | ICD-10-CM | POA: Diagnosis present

## 2020-07-15 ENCOUNTER — Other Ambulatory Visit: Payer: Self-pay | Admitting: Physician Assistant

## 2020-07-15 ENCOUNTER — Other Ambulatory Visit (HOSPITAL_COMMUNITY): Payer: Self-pay | Admitting: Physician Assistant

## 2020-07-15 DIAGNOSIS — R600 Localized edema: Secondary | ICD-10-CM

## 2020-07-15 DIAGNOSIS — R0602 Shortness of breath: Secondary | ICD-10-CM

## 2020-07-29 ENCOUNTER — Emergency Department: Payer: Medicaid Other

## 2020-07-29 ENCOUNTER — Other Ambulatory Visit: Payer: Self-pay

## 2020-07-29 ENCOUNTER — Encounter: Payer: Self-pay | Admitting: Emergency Medicine

## 2020-07-29 ENCOUNTER — Emergency Department
Admission: EM | Admit: 2020-07-29 | Discharge: 2020-07-29 | Disposition: A | Discharge status: Home / Self Care | Payer: Medicaid Other | Attending: Emergency Medicine | Admitting: Emergency Medicine

## 2020-07-29 DIAGNOSIS — F17210 Nicotine dependence, cigarettes, uncomplicated: Secondary | ICD-10-CM | POA: Insufficient documentation

## 2020-07-29 DIAGNOSIS — M79605 Pain in left leg: Secondary | ICD-10-CM | POA: Insufficient documentation

## 2020-07-29 DIAGNOSIS — J45909 Unspecified asthma, uncomplicated: Secondary | ICD-10-CM | POA: Insufficient documentation

## 2020-07-29 DIAGNOSIS — Z7951 Long term (current) use of inhaled steroids: Secondary | ICD-10-CM | POA: Insufficient documentation

## 2020-07-29 NOTE — ED Triage Notes (Signed)
Pt comes into the ED via POV c/o lower left leg pain between her calf and ankle.  Pt states the pain is worse with ambulation.  Pt was sent over to r/o DVT.  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  Pt in NAD at this time with even and unlabored respirations and is ambulatory to triage.  Pt states she does have SHOB at times.

## 2020-07-29 NOTE — Discharge Instructions (Signed)
Please wear compression stockings to help with left lower extremity swelling. Please wear arch supports.

## 2020-07-29 NOTE — ED Provider Notes (Signed)
[facility]-EMERGENCY DEPARTMENT  ____________________________________________  Time seen: Approximately 2:58 PM  I have reviewed the triage vital signs and the nursing notes.   HISTORY  Chief Complaint Leg Pain   Historian Patient     HPI [name] is a 50 y.o. female presents to the emergency department with left lower extremity pain.  Patient has a history of varicose veins and she thinks that some of her varicose veins are more distended than what they normally are.  She also states that she has had some mild swelling of the left ankle.  She reports that she has had lower extremity edema on the left in the past and has never been diagnosed with DVT or PE.  She denies shortness of breath, chest tightness or chest pain.  Patient is secondarily concerned about dry mouth.  No other alleviating measures have been attempted.   Past Medical History:  Diagnosis Date  . Allergy   . Asthma   . Bladder disorder   . Bladder infection   . Constipation   . Eczema   . Endometriosis   . Ovarian cyst   . UTI (lower urinary tract infection)      Immunizations up to date:  Yes.     Past Medical History:  Diagnosis Date  . Allergy   . Asthma   . Bladder disorder   . Bladder infection   . Constipation   . Eczema   . Endometriosis   . Ovarian cyst   . UTI (lower urinary tract infection)     Patient Active Problem List   Diagnosis Date Noted  . Fatigue 04/11/2012  . GERD (gastroesophageal reflux disease) 04/05/2012  . Nasal congestion 04/05/2012  . Rash 04/05/2012  . Right shoulder injury 04/05/2012  . Shoulder pain 04/05/2012  . Dysuria 03/21/2012  . Painful bladder spasm 03/21/2012  . Pelvic pain in female 11/26/2010  . Endometriosis 11/26/2010  . Constipation 11/26/2010  . Cystocele 11/26/2010  . Rectocele 11/26/2010    Past Surgical History:  Procedure Laterality Date  . BLADDER SURGERY    . LAPAROSCOPY    . TUBAL LIGATION      Prior to Admission  medications   Medication Sig Start Date End Date Taking? Authorizing Provider  buPROPion (WELLBUTRIN XL) 150 MG 24 hr tablet  08/12/16   [provider]  FLOVENT HFA 220 MCG/ACT inhaler  08/12/16   [provider]  furosemide (LASIX) 20 MG tablet Take 1 tablet (20 mg total) by mouth daily for 7 days. 11/28/17 12/05/17  Emily Filbert, MD  hydrOXYzine (VISTARIL) 25 MG capsule  08/12/16   [provider]  Melatonin 10 MG CAPS  08/12/16   [provider]  meloxicam (MOBIC) 15 MG tablet  08/12/16   [provider]  Multiple Vitamin (MULTIVITAMIN) tablet Take 1 tablet by mouth daily.     [provider]  norethindrone (AYGESTIN) 5 MG tablet Take 1 tablet (5 mg total) by mouth daily. Patient not taking: Reported on 05/09/2019 08/30/16   Conard Novak, MD  predniSONE (DELTASONE) 10 MG tablet Take 6 tablets  today, on day 2 take 5 tablets, day 3 take 4 tablets, day 4 take 3 tablets, day 5 take  2 tablets and 1 tablet the last day Patient not taking: Reported on 05/09/2019 10/05/17   Eather Colas  [facility] HFA 108 [phone]) MCG/ACT inhaler  08/12/16   [provider]  tiZANidine (ZANAFLEX) 4 MG tablet  08/12/16  [provider]  Anner Crete 27.5-15.6 MCG CAPS  08/15/16   [provider]  valACYclovir (VALTREX) 500 MG tablet  08/24/16   [provider]    Allergies Codeine, Elemental sulfur, Penicillins, Sulfa antibiotics, and Sulfamethoxazole  Family History  Problem Relation Age of Onset  . Heart disease Father   . GER disease Father   . Hypertension Father   . GER disease Mother   . GER disease Brother   . GER disease Sister     Social History Social History   Tobacco Use  . Smoking status: Current Every Day Smoker    Packs/day: 0.50    Years: 20.00    Pack years: 10.00    Types: Cigarettes  . Smokeless tobacco: Never Used  Vaping Use  . Vaping Use: Never used  Substance Use Topics  .  Alcohol use: Yes    Alcohol/week: 4.0 standard drinks    Types: 2 Glasses of wine, 2 Cans of beer per week    Comment: 3 x per week  . Drug use: No     Review of Systems  Constitutional: No fever/chills Eyes:  No discharge ENT: No upper respiratory complaints. Respiratory: no cough. No SOB/ use of accessory muscles to breath Gastrointestinal:   No nausea, no vomiting.  No diarrhea.  No constipation. Musculoskeletal: Patient has left leg pain.  Skin: Negative for rash, abrasions, lacerations, ecchymosis.    ____________________________________________   PHYSICAL EXAM:  VITAL SIGNS: ED Triage Vitals  Enc Vitals Group     BP 07/29/20 1308 (!) 143/82     Pulse Rate 07/29/20 1308 74     Resp 07/29/20 1308 18     Temp 07/29/20 1308 97.9 F (36.6 C)     Temp Source 07/29/20 1308 Oral     SpO2 07/29/20 1308 98 %     Weight 07/29/20 1311 180 lb (81.6 kg)     Height 07/29/20 1311 5\' 3"  (1.6 m)     Head Circumference --      Peak Flow --      Pain Score 07/29/20 1311 10     Pain Loc --      Pain Edu? --      Excl. in GC? --      Constitutional: Alert and oriented. Well appearing and in no acute distress. Eyes: Conjunctivae are normal. PERRL. EOMI. Head: Atraumatic. ENT:      Nose: No congestion/rhinnorhea.      Mouth/Throat: Mucous membranes are moist.  Neck: No stridor.  No cervical spine tenderness to palpation. Cardiovascular: Normal rate, regular rhythm. Normal S1 and S2.  Good peripheral circulation. Respiratory: Normal respiratory effort without tachypnea or retractions. Lungs CTAB. Good air entry to the bases with no decreased or absent breath sounds Gastrointestinal: Bowel sounds x 4 quadrants. Soft and nontender to palpation. No guarding or rigidity. No distention. Musculoskeletal: Full range of motion to all extremities. No obvious deformities noted Neurologic:  Normal for age. No gross focal neurologic deficits are appreciated.  Skin: No erythema or edema of  the left lower extremity. Psychiatric: Mood and affect are normal for age. Speech and behavior are normal.   ____________________________________________   LABS (all labs ordered are listed, but only abnormal results are displayed)  Labs Reviewed - No data to display ____________________________________________  EKG   ____________________________________________  RADIOLOGY 07/31/20, personally viewed and evaluated these images (plain radiographs) as part of my medical decision making, as well as reviewing the written  report by the radiologist.  US Venous Img Lower Unilateral Left  Result Date: 07/29/2020 CLINICAL DATA:  Lower extremity pain and edema EXAM: LEFT LOWER EXTREMITY VENOUS DUPLEX ULTRASOUND TECHNIQUE: Gray-scale sonography with graded compression, as well as color Doppler and duplex ultrasound were performed to evaluate the left lower extremity deep venous system from the level of the common femoral vein and including the common femoral, femoral, profunda femoral, popliteal and calf veins including the posterior tibial, peroneal and gastrocnemius veins when visible. The superficial great saphenous vein was also interrogated. Spectral Doppler was utilized to evaluate flow at rest and with distal augmentation maneuvers in the common femoral, femoral and popliteal veins. COMPARISON:  None. FINDINGS: Contralateral Common Femoral Vein: Respiratory phasicity is normal and symmetric with the symptomatic side. No evidence of thrombus. Normal compressibility. Common Femoral Vein: No evidence of thrombus. Normal compressibility, respiratory phasicity and response to augmentation. Saphenofemoral Junction: No evidence of thrombus. Normal compressibility and flow on color Doppler imaging. Profunda Femoral Vein: No evidence of thrombus. Normal compressibility and flow on color Doppler imaging. Femoral Vein: No evidence of thrombus. Normal compressibility, respiratory phasicity and response  to augmentation. Popliteal Vein: No evidence of thrombus. Normal compressibility, respiratory phasicity and response to augmentation. Calf Veins: No evidence of thrombus. Normal compressibility and flow on color Doppler imaging. Superficial Great Saphenous Vein: No evidence of thrombus. Normal compressibility. Venous Reflux:  None. Other Findings: Several patent superficial venous varicosities in the upper calf region medially. IMPRESSION: No evidence of deep venous thrombosis in the left lower extremity. Right common femoral vein also patent. Superficial venous varicosities in the left medial calf, patent. Electronically Signed   By: Bretta Bang III M.D.   On: 07/29/2020 14:22    ____________________________________________    PROCEDURES  Procedure(s) performed:     Procedures     Medications - No data to display   ____________________________________________   INITIAL IMPRESSION / ASSESSMENT AND PLAN / ED COURSE  Pertinent labs & imaging results that were available during my care of the patient were reviewed by me and considered in my medical decision making (see chart for details).    Assessment and Plan:   Leg pain 50 year old female presents to the emergency department with left lower extremity discomfort between her left calf and left ankle.  Vital signs were reassuring at triage.  Venous ultrasound shows no signs of DVT.  Patient had no erythema or significant edema of the lower extremity.  Recommended compression stockings and elevation at night.  Return precautions were given to return with new or worsening symptoms.     ____________________________________________  FINAL CLINICAL IMPRESSION(S) / ED DIAGNOSES  Final diagnoses:  Left leg pain      NEW MEDICATIONS STARTED DURING THIS VISIT:  ED Discharge Orders    None          This chart was dictated using voice recognition software/Dragon. Despite best efforts to proofread, errors can occur  which can change the meaning. Any change was purely unintentional.     Orvil Feil, PA-C 07/29/20 1503    Minna Antis, MD 07/31/20 [phone]

## 2020-08-31 ENCOUNTER — Emergency Department: Payer: Medicaid Other

## 2020-08-31 ENCOUNTER — Emergency Department
Admission: EM | Admit: 2020-08-31 | Discharge: 2020-08-31 | Disposition: A | Discharge status: Home / Self Care | Payer: Medicaid Other | Attending: Emergency Medicine | Admitting: Emergency Medicine

## 2020-08-31 ENCOUNTER — Other Ambulatory Visit: Payer: Self-pay

## 2020-08-31 ENCOUNTER — Encounter: Payer: Self-pay | Admitting: Emergency Medicine

## 2020-08-31 DIAGNOSIS — R0602 Shortness of breath: Secondary | ICD-10-CM | POA: Insufficient documentation

## 2020-08-31 DIAGNOSIS — R102 Pelvic and perineal pain: Secondary | ICD-10-CM | POA: Diagnosis not present

## 2020-08-31 DIAGNOSIS — F17210 Nicotine dependence, cigarettes, uncomplicated: Secondary | ICD-10-CM | POA: Diagnosis not present

## 2020-08-31 DIAGNOSIS — J45909 Unspecified asthma, uncomplicated: Secondary | ICD-10-CM | POA: Insufficient documentation

## 2020-08-31 DIAGNOSIS — N939 Abnormal uterine and vaginal bleeding, unspecified: Secondary | ICD-10-CM

## 2020-08-31 LAB — COMPREHENSIVE METABOLIC PANEL
ALT: 18 U/L (ref 0–44)
AST: 14 U/L — ABNORMAL LOW (ref 15–41)
Albumin: 3.8 g/dL (ref 3.5–5.0)
Alkaline Phosphatase: 51 U/L (ref 38–126)
Anion gap: 8 (ref 5–15)
BUN: 14 mg/dL (ref 6–20)
CO2: 24 mmol/L (ref 22–32)
Calcium: 8.9 mg/dL (ref 8.9–10.3)
Chloride: 106 mmol/L (ref 98–111)
Creatinine, Ser: 0.72 mg/dL (ref 0.44–1.00)
GFR, Estimated: >60 mL/min (ref >60)
Glucose, Bld: 95 mg/dL (ref 70–99)
Potassium: 4.1 mmol/L (ref 3.5–5.1)
Sodium: 138 mmol/L (ref 135–145)
Total Bilirubin: 0.7 mg/dL (ref 0.3–1.2)
Total Protein: 7.2 g/dL (ref 6.5–8.1)

## 2020-08-31 LAB — CBC
HCT: 40 % (ref 36.0–46.0)
Hemoglobin: 13.8 g/dL (ref 12.0–15.0)
MCH: 32.9 pg (ref 26.0–34.0)
MCHC: 34.5 g/dL (ref 30.0–36.0)
MCV: 95.2 fL (ref 80.0–100.0)
Platelets: 254 10*3/uL (ref 150–400)
RBC: 4.2 MIL/uL (ref 3.87–5.11)
RDW: 13.4 % (ref 11.5–15.5)
WBC: 10.4 10*3/uL (ref 4.0–10.5)
nRBC: 0 % (ref 0.0–0.2)

## 2020-08-31 LAB — URINALYSIS, COMPLETE (UACMP) WITH MICROSCOPIC
Bacteria, UA: NONE SEEN
Bilirubin Urine: NEGATIVE
Glucose, UA: NEGATIVE mg/dL
Ketones, ur: NEGATIVE mg/dL
Leukocytes,Ua: NEGATIVE
Nitrite: NEGATIVE
Protein, ur: 30 mg/dL — AB
RBC / HPF: >50 RBC/hpf — ABNORMAL HIGH (ref 0–5)
Specific Gravity, Urine: 1.013 (ref 1.005–1.030)
pH: 7 (ref 5.0–8.0)

## 2020-08-31 LAB — LIPASE, BLOOD: Lipase: 33 U/L (ref 11–51)

## 2020-08-31 MED ORDER — KETOROLAC TROMETHAMINE 30 MG/ML IJ SOLN
30.0000 mg | Freq: Once | INTRAMUSCULAR | Status: AC
  Administered 2020-08-31: 30 mg via INTRAMUSCULAR
  Filled 2020-08-31: qty 1

## 2020-08-31 MED ORDER — IPRATROPIUM-ALBUTEROL 0.5-2.5 (3) MG/3ML IN SOLN
3.0000 mL | Freq: Once | RESPIRATORY_TRACT | Status: AC
  Administered 2020-08-31: 3 mL via RESPIRATORY_TRACT
  Filled 2020-08-31: qty 3

## 2020-08-31 NOTE — ED Provider Notes (Signed)
[facility] Emergency Department Provider Note   ____________________________________________    I have reviewed the triage vital signs and the nursing notes.   HISTORY  Chief Complaint Abdominal Pain     HPI [name] is a 50 y.o. female who presents with multiple complaints.  Primarily she complains of vaginal bleeding which started over the last several days.  She is concerned because she has not had a period in over 6 months.  She also describes some mild pelvic cramping in the right.  No dysuria, no frequency.  No fevers or chills.  In addition she feels mildly short of breath as well.  No chest pain or pleurisy  Past Medical History:  Diagnosis Date  . Allergy   . Asthma   . Bladder disorder   . Bladder infection   . Constipation   . Eczema   . Endometriosis   . Ovarian cyst   . UTI (lower urinary tract infection)     Patient Active Problem List   Diagnosis Date Noted  . Fatigue 04/11/2012  . GERD (gastroesophageal reflux disease) 04/05/2012  . Nasal congestion 04/05/2012  . Rash 04/05/2012  . Right shoulder injury 04/05/2012  . Shoulder pain 04/05/2012  . Dysuria 03/21/2012  . Painful bladder spasm 03/21/2012  . Pelvic pain in female 11/26/2010  . Endometriosis 11/26/2010  . Constipation 11/26/2010  . Cystocele 11/26/2010  . Rectocele 11/26/2010    Past Surgical History:  Procedure Laterality Date  . BLADDER SURGERY    . LAPAROSCOPY    . TUBAL LIGATION      Prior to Admission medications   Medication Sig Start Date End Date Taking? Authorizing Provider  buPROPion (WELLBUTRIN XL) 150 MG 24 hr tablet  08/12/16   [provider]  FLOVENT HFA 220 MCG/ACT inhaler  08/12/16   [provider]  furosemide (LASIX) 20 MG tablet Take 1 tablet (20 mg total) by mouth daily for 7 days. 11/28/17 12/05/17  Emily Filbert, MD  hydrOXYzine (VISTARIL) 25 MG capsule  08/12/16   [provider]   Melatonin 10 MG CAPS  08/12/16   [provider]  meloxicam (MOBIC) 15 MG tablet  08/12/16   [provider]  Multiple Vitamin (MULTIVITAMIN) tablet Take 1 tablet by mouth daily.     [provider]  norethindrone (AYGESTIN) 5 MG tablet Take 1 tablet (5 mg total) by mouth daily. Patient not taking: Reported on 05/09/2019 08/30/16   Conard Novak, MD  predniSONE (DELTASONE) 10 MG tablet Take 6 tablets  today, on day 2 take 5 tablets, day 3 take 4 tablets, day 4 take 3 tablets, day 5 take  2 tablets and 1 tablet the last day Patient not taking: Reported on 05/09/2019 10/05/17   Eather Colas  [facility] HFA 108 [phone]) MCG/ACT inhaler  08/12/16   [provider]  tiZANidine (ZANAFLEX) 4 MG tablet  08/12/16   [provider]  Anner Crete 27.5-15.6 MCG CAPS  08/15/16   [provider]  valACYclovir (VALTREX) 500 MG tablet  08/24/16   [provider]     Allergies Codeine, Elemental sulfur, Penicillins, Sulfa antibiotics, and Sulfamethoxazole  Family History  Problem Relation Age of Onset  . Heart disease Father   . GER disease Father   . Hypertension Father   . GER disease Mother   . GER disease Brother   . GER disease Sister     Social History Social History  Tobacco Use  . Smoking status: Current Every Day Smoker    Packs/day: 0.50    Years: 20.00    Pack years: 10.00    Types: Cigarettes  . Smokeless tobacco: Never Used  Vaping Use  . Vaping Use: Never used  Substance Use Topics  . Alcohol use: Yes    Alcohol/week: 4.0 standard drinks    Types: 2 Glasses of wine, 2 Cans of beer per week    Comment: 3 x per week  . Drug use: No    Review of Systems  Constitutional: No fever/chills Eyes: No visual changes.  ENT: No sore throat. Cardiovascular: Denies chest pain. Respiratory: Mild cough Gastrointestinal: As above Genitourinary: As above Musculoskeletal: Negative for back pain. Skin: Negative for  rash. Neurological: Negative for headaches or weakness   ____________________________________________   PHYSICAL EXAM:  VITAL SIGNS: ED Triage Vitals  Enc Vitals Group     BP 08/31/20 1517 (!) 131/50     Pulse Rate 08/31/20 1517 65     Resp 08/31/20 1517 20     Temp 08/31/20 1517 98.4 F (36.9 C)     Temp Source 08/31/20 1517 Oral     SpO2 08/31/20 1517 97 %     Weight 08/31/20 1519 79.8 kg (176 lb)     Height 08/31/20 1519 1.6 m (5\' 3" )     Head Circumference --      Peak Flow --      Pain Score 08/31/20 1519 10     Pain Loc --      Pain Edu? --      Excl. in GC? --     Constitutional: Alert and oriented.   Nose: No congestion/rhinnorhea. Mouth/Throat: Mucous membranes are moist.   Neck:  Painless ROM Cardiovascular: Normal rate, regular rhythm Good peripheral circulation. Respiratory: Normal respiratory effort.  No retractions. Lungs CTAB. Gastrointestinal: Soft and nontender. No distention.   Genitourinary: deferred Musculoskeletal:  Warm and well perfused Neurologic:  Normal speech and language. No gross focal neurologic deficits are appreciated.  Skin:  Skin is warm, dry and intact. No rash noted. Psychiatric: Mood and affect are normal. Speech and behavior are normal.  ____________________________________________   LABS (all labs ordered are listed, but only abnormal results are displayed)  Labs Reviewed  COMPREHENSIVE METABOLIC PANEL - Abnormal; Notable for the following components:      Result Value   AST 14 (*)    All other components within normal limits  URINALYSIS, COMPLETE (UACMP) WITH MICROSCOPIC - Abnormal; Notable for the following components:   Color, Urine AMBER (*)    APPearance CLEAR (*)    Hgb urine dipstick LARGE (*)    Protein, ur 30 (*)    RBC / HPF >50 (*)    All other components within normal limits  LIPASE, BLOOD  CBC    ____________________________________________  EKG   ____________________________________________  RADIOLOGY  Chest x-ray viewed by me, no acute Pelvis ultrasound unremarkable ____________________________________________   PROCEDURES  Procedure(s) performed: No  Procedures   Critical Care performed: No ____________________________________________   INITIAL IMPRESSION / ASSESSMENT AND PLAN / ED COURSE  Pertinent labs & imaging results that were available during my care of the patient were reviewed by me and considered in my medical decision making (see chart for details).  Patient with vaginal bleeding as described above, lab work is reassuring, hemoglobin is normal.  Suspect abnormal bleeding related to perimenopausal status  Sent for ultrasound which is reassuring.  X-ray unremarkable.  Patient will follow-up with gynecologist    ____________________________________________   FINAL CLINICAL IMPRESSION(S) / ED DIAGNOSES  Final diagnoses:  Vaginal bleeding  Pelvic pain in female        Note:  This document was prepared using Dragon voice recognition software and may include unintentional dictation errors.   Jene Every, MD 08/31/20 2137

## 2020-08-31 NOTE — ED Triage Notes (Signed)
Pt to ED via POV, c/o lower abdominal pain, and back pain. Pt states has not had a period in 6 months, started several days ago having spotting and then vaginal bleeding that started several days ago.

## 2020-09-02 ENCOUNTER — Encounter: Payer: Self-pay | Admitting: Obstetrics and Gynecology

## 2020-09-02 ENCOUNTER — Other Ambulatory Visit: Payer: Self-pay

## 2020-09-02 ENCOUNTER — Ambulatory Visit (INDEPENDENT_AMBULATORY_CARE_PROVIDER_SITE_OTHER): Payer: Medicaid Other | Admitting: Obstetrics and Gynecology

## 2020-09-02 VITALS — BP 118/72 | Ht 63.0 in | Wt 208.8 lb

## 2020-09-02 DIAGNOSIS — Z1231 Encounter for screening mammogram for malignant neoplasm of breast: Secondary | ICD-10-CM

## 2020-09-02 DIAGNOSIS — Z1211 Encounter for screening for malignant neoplasm of colon: Secondary | ICD-10-CM | POA: Diagnosis not present

## 2020-09-02 DIAGNOSIS — N939 Abnormal uterine and vaginal bleeding, unspecified: Secondary | ICD-10-CM

## 2020-09-02 DIAGNOSIS — R102 Pelvic and perineal pain: Secondary | ICD-10-CM

## 2020-09-02 DIAGNOSIS — N951 Menopausal and female climacteric states: Secondary | ICD-10-CM | POA: Diagnosis not present

## 2020-09-02 DIAGNOSIS — R875 Abnormal microbiological findings in specimens from female genital organs: Secondary | ICD-10-CM

## 2020-09-02 MED ORDER — MEDROXYPROGESTERONE ACETATE 10 MG PO TABS: 10.0000 mg | ORAL_TABLET | Freq: Every day | ORAL | 0 refills | Status: DC

## 2020-09-02 NOTE — Progress Notes (Signed)
Patient ID: [name], female   DOB: 06/17/1970, 50 y.o.   MRN: [id number]  Reason for Consult: Bleeding  Referred by [facility]  Subjective:     HPI:  [name] is a 50 y.o. female . She is here today with several concerns  She reports that she has not had a menstrual cycle since 11/30/2020. She thought that she was in menopause. She reports that she has been having bleeding since Friday April 29th. She describes dark blood. She was seen in the ER and had a pelvic US which showed normal bilateral ovaries and an endometrial thickness of 6 mm.   She is also having sight sided abdominal pain. She is feeling swelling, bloating, and nausea.  She can not lay on her right side.   She also has significant pain with intercourse.   Gynecological History  No LMP recorded. (Menstrual status: Perimenopausal). Menarche: 13 Menopause: August 2021?  She reports passage of large clots She reports sensations of gushing or flooding of blood. She reports accidents where she bleeds through her clothing. She reports that she changes a saturated pad or tampon more frequently than every hour.  She reports that pain from her periods limits her activities.  History of fibroids, polyps, or ovarian cysts? : yes  History of PCOS? no Hstory of Endometriosis? Yes- history of 2 laparoscopies History of abnormal pap smears? yes Have you had any sexually transmitted infections in the past? no  Last Pap:05/09/2019- NIL  She identifies as a female. She is sexually active with men.   She has dyspareunia.   Obstetrical History OB History  Gravida Para Term Preterm AB Living  4 3 3   1 2   SAB IAB Ectopic Multiple Live Births  0 1          # Outcome Date GA Lbr Len/2nd Weight Sex Delivery Anes PTL Lv  4 Term 2004    F Vag-Spont     3 Term 2002 [redacted]w[redacted]d   M Vag-Spont        Birth Comments: IUFD  2 IAB 1995          1 Term 10 [redacted]w[redacted]d   F Vag-Spont        Past Medical History:   Diagnosis Date  . Allergy   . Asthma   . Bladder disorder   . Bladder infection   . Constipation   . Eczema   . Endometriosis   . Ovarian cyst   . UTI (lower urinary tract infection)    Family History  Problem Relation Age of Onset  . Heart disease Father   . GER disease Father   . Hypertension Father   . GER disease Mother   . GER disease Brother   . GER disease Sister    Past Surgical History:  Procedure Laterality Date  . BLADDER SURGERY    . LAPAROSCOPY    . TUBAL LIGATION      Short Social History:  Social History   Tobacco Use  . Smoking status: Current Every Day Smoker    Packs/day: 0.50    Years: 20.00    Pack years: 10.00    Types: Cigarettes  . Smokeless tobacco: Never Used  Substance Use Topics  . Alcohol use: Yes    Alcohol/week: 4.0 standard drinks    Types: 2 Glasses of wine, 2 Cans of beer per week    Comment: 3 x per week    Allergies  Allergen Reactions  .  Codeine Anaphylaxis    Shortness of breath  . Elemental Sulfur Rash  . Penicillins Itching and Other (See Comments)    Causes yeast infections palpatations   . Sulfa Antibiotics Rash  . Sulfamethoxazole Itching and Other (See Comments)    Causes yeast infections    Current Outpatient Medications  Medication Sig Dispense Refill  . medroxyPROGESTERone (PROVERA) 10 MG tablet Take 1 tablet (10 mg total) by mouth daily. 10 tablet 0  . buPROPion (WELLBUTRIN XL) 150 MG 24 hr tablet  (Patient not taking: Reported on 09/02/2020)  2  . Cholecalciferol (VITAMIN D3) 1.25 MG (50000 UT) CAPS Take 1 capsule by mouth every 30 (thirty) days.    Marland Kitchen FLOVENT HFA 220 MCG/ACT inhaler  (Patient not taking: Reported on 09/02/2020)  5  . furosemide (LASIX) 20 MG tablet Take 1 tablet (20 mg total) by mouth daily for 7 days. 7 tablet 0  . hydrochlorothiazide (HYDRODIURIL) 12.5 MG tablet Take 12.5 mg by mouth daily.    . hydrOXYzine (VISTARIL) 25 MG capsule  (Patient not taking: Reported on 09/02/2020)  5  .  ibuprofen (ADVIL) 600 MG tablet Take by mouth.    . Melatonin 10 MG CAPS  (Patient not taking: Reported on 09/02/2020)  5  . meloxicam (MOBIC) 15 MG tablet  (Patient not taking: Reported on 09/02/2020)  5  . Multiple Vitamin (MULTIVITAMIN) tablet Take 1 tablet by mouth daily.  (Patient not taking: Reported on 09/02/2020)    . norethindrone (AYGESTIN) 5 MG tablet Take 1 tablet (5 mg total) by mouth daily. (Patient not taking: No sig reported) 30 tablet 2  . predniSONE (DELTASONE) 10 MG tablet Take 6 tablets  today, on day 2 take 5 tablets, day 3 take 4 tablets, day 4 take 3 tablets, day 5 take  2 tablets and 1 tablet the last day (Patient not taking: No sig reported) 21 tablet 0  . PROAIR HFA 108 (90 Base) MCG/ACT inhaler  (Patient not taking: Reported on 09/02/2020)  5  . tiZANidine (ZANAFLEX) 4 MG tablet  (Patient not taking: Reported on 09/02/2020)  5  . UTIBRON NEOHALER 27.5-15.6 MCG CAPS  (Patient not taking: Reported on 09/02/2020)  5  . valACYclovir (VALTREX) 500 MG tablet  (Patient not taking: Reported on 09/02/2020)  5   No current facility-administered medications for this visit.    Review of Systems  Constitutional: Positive for fatigue and fever.  Eyes:       + change in vision Respiratory: Positive for cough, shortness of breath and wheezing.       + sore throat  Cardiovascular: Positive for chest pain, chest tightness, dyspnea with exertion and leg swelling.  GI: Positive for abdominal pain, diarrhea, nausea and vomiting.       + constipation GU: Positive for difficulty urinating, dysuria and frequency.  Musculoskeletal: Positive for joint pain and myalgias.  Skin: Positive for rash.       + itching Neurological: Positive for dizziness and headaches.  Hematologic: Positive for bruises/bleeds easily.        Objective:  Objective   Vitals:   09/02/20 1416  BP: 118/72  Weight: 208 lb 12.8 oz (94.7 kg)  Height: 5\' 3"  (1.6 m)   Body mass index is 36.99 kg/m.  Physical  Exam Vitals and nursing note reviewed. Exam conducted with a chaperone present.  Constitutional:      Appearance: Normal appearance. She is well-developed.  HENT:     Head: Normocephalic and atraumatic.  Eyes:  Extraocular Movements: Extraocular movements intact.     Pupils: Pupils are equal, round, and reactive to light.  Cardiovascular:     Rate and Rhythm: Normal rate and regular rhythm.  Pulmonary:     Effort: Pulmonary effort is normal. No respiratory distress.     Breath sounds: Normal breath sounds.  Abdominal:     General: Abdomen is flat.     Palpations: Abdomen is soft.  Genitourinary:    Comments: External: Normal appearing vulva. No lesions noted.  Speculum examination: She declines today. Bimanual examination: Uterus midline, non-tender, normal in size, shape and contour.  No CMT. No adnexal masses. No adnexal tenderness. Pelvis not fixed. Musculoskeletal:        General: No signs of injury.  Skin:    General: Skin is warm and dry.  Neurological:     Mental Status: She is alert and oriented to person, place, and time.  Psychiatric:        Behavior: Behavior normal.        Thought Content: Thought content normal.        Judgment: Judgment normal.     Assessment/Plan:     50 yo with abnormal uterine bleeding.  1. Will check menopausal status. If she is in menopause and this is postmenopausal bleeding she will Need endometrial sampling. She declines speculum exam today. She would prefer to have hyteroscopy D&C in the OR.  Will start progesterone for acute management of abnormal uterine bleeding. Will check nuswab for infection.   2. Dyspareunia- podsibly related to symptoms of menopause.  Given a list of noonhormonal vaginal and vulvar moisturizers and lubricants, pending work up of abnormal uterine bleeding can consider hormone replacement options.  3. Breast cancer screening- Mammogram ordered today  4. Colon cancer screening- GI referral  5. Close follow  up planned for 2 weeks from now.  More than 30 minutes were spent face to face with the patient in the room, reviewing the medical record, labs and images, and coordinating care for the patient. The plan of management was discussed in detail and counseling was provided.      Adelene Idler MD [facility], [facility] 09/04/2020 12:23 PM     Check menopause status Declines speculum smapling or OR Progesterone for bleeding Follow up in 2 weeks

## 2020-09-02 NOTE — Patient Instructions (Signed)
Vaginal/ Vulvar Moisturizer Use 3-5 times a week at bedtime  Hyalo Gyn Revaree Replens Carlson Key-E suppositories Vitamin E oil, olive oil, coconut oil   Water-based Lubricants  Astroglide KY Jelly  Luvena Aquagel  Silicone- based Lubricants Pjur PINK Astroglide silicone Uberlube    

## 2020-09-03 LAB — TSH+FREE T4
Free T4: 1.4 ng/dL (ref 0.82–1.77)
TSH: 0.725 u[IU]/mL (ref 0.450–4.500)

## 2020-09-03 LAB — FOLLICLE STIMULATING HORMONE: FSH: 43.1 m[IU]/mL

## 2020-09-03 LAB — ESTRADIOL: Estradiol: <5 pg/mL

## 2020-09-07 LAB — NUSWAB VAGINITIS PLUS (VG+)
Candida albicans, NAA: NEGATIVE
Candida glabrata, NAA: NEGATIVE
Chlamydia trachomatis, NAA: NEGATIVE
Neisseria gonorrhoeae, NAA: NEGATIVE
Trich vag by NAA: NEGATIVE

## 2020-09-14 ENCOUNTER — Telehealth: Payer: Self-pay

## 2020-09-14 NOTE — Telephone Encounter (Signed)
-----   Message from Natale Milch, MD sent at 09/03/2020  4:54 PM EDT ----- Regarding: Surgery request Surgery Booking Request Patient Full Name:  [name]  MRN: [id number]  DOB: 09/01/1970  Surgeon: Natale Milch, MD  Requested Surgery Date and Time: May or June please  Primary Diagnosis AND Code: Postpartum bleeding Secondary Diagnosis and Code:  Surgical Procedure: Hysteroscopy D&C RNFA Requested?: No L&D Notification: No Admission Status: same day surgery Length of Surgery: 25 min Special Case Needs: No H&P: No Phone Interview???:  Yes Interpreter: No Medical Clearance:  No Special Scheduling Instructions: No Any known health/anesthesia issues, diabetes, sleep apnea, latex allergy, defibrillator/pacemaker?: No Acuity: P2   (P1 highest, P2 delay may cause harm, P3 low, elective gyn, P4 lowest)

## 2020-09-14 NOTE — Telephone Encounter (Signed)
Tried to call patient. VMB full.

## 2020-09-24 NOTE — Telephone Encounter (Signed)
09/24/20 - tried to call pt - VM is full

## 2020-10-02 NOTE — Telephone Encounter (Signed)
Tried to call pt - VM full CRS aware

## 2020-10-05 ENCOUNTER — Ambulatory Visit: Payer: Medicaid Other | Admitting: Obstetrics and Gynecology

## 2020-12-03 ENCOUNTER — Emergency Department
Admission: EM | Admit: 2020-12-03 | Discharge: 2020-12-03 | Disposition: A | Discharge status: Home / Self Care | Payer: Medicaid Other | Attending: Emergency Medicine | Admitting: Emergency Medicine

## 2020-12-03 ENCOUNTER — Emergency Department: Payer: Medicaid Other

## 2020-12-03 ENCOUNTER — Other Ambulatory Visit: Payer: Self-pay

## 2020-12-03 DIAGNOSIS — F17210 Nicotine dependence, cigarettes, uncomplicated: Secondary | ICD-10-CM | POA: Diagnosis not present

## 2020-12-03 DIAGNOSIS — R0789 Other chest pain: Secondary | ICD-10-CM | POA: Insufficient documentation

## 2020-12-03 DIAGNOSIS — J45909 Unspecified asthma, uncomplicated: Secondary | ICD-10-CM | POA: Diagnosis not present

## 2020-12-03 DIAGNOSIS — Z9189 Other specified personal risk factors, not elsewhere classified: Secondary | ICD-10-CM

## 2020-12-03 DIAGNOSIS — R079 Chest pain, unspecified: Secondary | ICD-10-CM

## 2020-12-03 LAB — BASIC METABOLIC PANEL
Anion gap: 9 (ref 5–15)
BUN: 11 mg/dL (ref 6–20)
CO2: 22 mmol/L (ref 22–32)
Calcium: 8.8 mg/dL — ABNORMAL LOW (ref 8.9–10.3)
Chloride: 107 mmol/L (ref 98–111)
Creatinine, Ser: 0.79 mg/dL (ref 0.44–1.00)
GFR, Estimated: >60 mL/min (ref >60)
Glucose, Bld: 128 mg/dL — ABNORMAL HIGH (ref 70–99)
Potassium: 3.7 mmol/L (ref 3.5–5.1)
Sodium: 138 mmol/L (ref 135–145)

## 2020-12-03 LAB — TROPONIN I (HIGH SENSITIVITY)
Troponin I (High Sensitivity): <2 ng/L (ref <18)
Troponin I (High Sensitivity): <2 ng/L (ref <18)

## 2020-12-03 LAB — CBC
HCT: 37.6 % (ref 36.0–46.0)
Hemoglobin: 13.4 g/dL (ref 12.0–15.0)
MCH: 34.5 pg — ABNORMAL HIGH (ref 26.0–34.0)
MCHC: 35.6 g/dL (ref 30.0–36.0)
MCV: 96.9 fL (ref 80.0–100.0)
Platelets: 223 10*3/uL (ref 150–400)
RBC: 3.88 MIL/uL (ref 3.87–5.11)
RDW: 13.1 % (ref 11.5–15.5)
WBC: 8.9 10*3/uL (ref 4.0–10.5)
nRBC: 0 % (ref 0.0–0.2)

## 2020-12-03 MED ORDER — ASPIRIN-ACETAMINOPHEN-CAFFEINE 250-250-65 MG PO TABS: 1.0000 | ORAL_TABLET | ORAL | Status: DC

## 2020-12-03 MED ORDER — ASPIRIN 81 MG PO CHEW
324.0000 mg | CHEWABLE_TABLET | Freq: Once | ORAL | Status: AC
  Administered 2020-12-03: 324 mg via ORAL
  Filled 2020-12-03: qty 4

## 2020-12-03 MED ORDER — ACETAMINOPHEN 500 MG PO TABS
1000.0000 mg | ORAL_TABLET | ORAL | Status: AC
  Administered 2020-12-03: 1000 mg via ORAL
  Filled 2020-12-03: qty 2

## 2020-12-03 NOTE — Discharge Instructions (Signed)

## 2020-12-03 NOTE — ED Provider Notes (Signed)
[facility] Emergency Department Provider Note   ____________________________________________   Event Date/Time   First MD Initiated Contact with Patient 12/03/20 1834     (approximate)  I have reviewed the triage vital signs and the nursing notes.   HISTORY  Chief Complaint Chest Pain, Shortness of Breath, and Numbness (Intermittent left arm )    HPI [name] is a 50 y.o. female history of endometriosis, UTIs acid reflux prior right shoulder injury  Patient reports that since yesterday she has been experiencing a sense of tightness in her chest.  Associated with it occasionally she is having a tingling feeling in both of her hands.  No shortness of breath.  No leg swelling.  No nausea or vomiting  Patient reports that she has been going through some social issues with her family, yesterday was particularly stressful she did get an argument with child of hers.  Since that point she does report she is felt tightness across her chest almost feels like she could be suffering from some type of stress or anxiety.  Denies personal history of heart disease though does run in her family and she is a smoker  She denies any sharp pain.  Denies shortness of breath.  Reports it is a tight feeling across her chest is a little more prominent on the left side than on the right.  No loss of function of the arms but reports of tingling numbing feeling in the hands at times  Past Medical History:  Diagnosis Date   Allergy    Asthma    Bladder disorder    Bladder infection    Constipation    Eczema    Endometriosis    Ovarian cyst    UTI (lower urinary tract infection)     Patient Active Problem List   Diagnosis Date Noted   Fatigue 04/11/2012   GERD (gastroesophageal reflux disease) 04/05/2012   Nasal congestion 04/05/2012   Rash 04/05/2012   Right shoulder injury 04/05/2012   Shoulder pain 04/05/2012   Dysuria 03/21/2012   Painful bladder spasm  03/21/2012   Pelvic pain in female 11/26/2010   Endometriosis 11/26/2010   Constipation 11/26/2010   Cystocele 11/26/2010   Rectocele 11/26/2010    Past Surgical History:  Procedure Laterality Date   BLADDER SURGERY     LAPAROSCOPY     TUBAL LIGATION      Prior to Admission medications   Medication Sig Start Date End Date Taking? Authorizing Provider  buPROPion (WELLBUTRIN XL) 150 MG 24 hr tablet  08/12/16   [provider]  Cholecalciferol (VITAMIN D3) 1.25 MG (50000 UT) CAPS Take 1 capsule by mouth every 30 (thirty) days. 07/29/20   [provider]  FLOVENT HFA 220 MCG/ACT inhaler  08/12/16   [provider]  furosemide (LASIX) 20 MG tablet Take 1 tablet (20 mg total) by mouth daily for 7 days. 11/28/17 12/05/17  Emily Filbert, MD  hydrochlorothiazide (HYDRODIURIL) 12.5 MG tablet Take 12.5 mg by mouth daily. 09/02/20   [provider]  hydrOXYzine (VISTARIL) 25 MG capsule  08/12/16   [provider]  ibuprofen (ADVIL) 600 MG tablet Take by mouth.    [provider]  medroxyPROGESTERone (PROVERA) 10 MG tablet Take 1 tablet (10 mg total) by mouth daily. 09/02/20   Natale Milch, MD  Melatonin 10 MG CAPS  08/12/16   [provider]  meloxicam (MOBIC) 15 MG tablet  08/12/16   [provider]  Multiple Vitamin (MULTIVITAMIN) tablet Take 1 tablet by mouth daily.  Patient not taking: Reported on 09/02/2020    [provider]  norethindrone (AYGESTIN) 5 MG tablet Take 1 tablet (5 mg total) by mouth daily. Patient not taking: No sig reported 08/30/16   Conard Novak, MD  predniSONE (DELTASONE) 10 MG tablet Take 6 tablets  today, on day 2 take 5 tablets, day 3 take 4 tablets, day 4 take 3 tablets, day 5 take  2 tablets and 1 tablet the last day Patient not taking: No sig reported 10/05/17   Eather Colas  [facility] HFA 108 [phone]) MCG/ACT inhaler  08/12/16   [provider]  tiZANidine  (ZANAFLEX) 4 MG tablet  08/12/16   [provider]  Anner Crete 27.5-15.6 MCG CAPS  08/15/16   [provider]  valACYclovir (VALTREX) 500 MG tablet  08/24/16   [provider]    Allergies Codeine, Elemental sulfur, Penicillins, Sulfa antibiotics, and Sulfamethoxazole  Family History  Problem Relation Age of Onset   Heart disease Father    GER disease Father    Hypertension Father    GER disease Mother    GER disease Brother    GER disease Sister     Social History Social History   Tobacco Use   Smoking status: Every Day    Packs/day: 0.50    Years: 20.00    Pack years: 10.00    Types: Cigarettes   Smokeless tobacco: Never  Vaping Use   Vaping Use: Never used  Substance Use Topics   Alcohol use: Yes    Alcohol/week: 4.0 standard drinks    Types: 2 Glasses of wine, 2 Cans of beer per week    Comment: 3 x per week   Drug use: No    Review of Systems Constitutional: No fever/chills Eyes: No visual changes. ENT: No sore throat. Cardiovascular: Denies chest pain.  Reports a sense of tightness across the chest and breastbone Respiratory: Denies shortness of breath. Gastrointestinal: No abdominal pain.   Genitourinary: Negative for dysuria. Musculoskeletal: Negative for back pain. Skin: Negative for rash. Neurological: Negative for headaches or weakness    ____________________________________________   PHYSICAL EXAM:  VITAL SIGNS: ED Triage Vitals  Enc Vitals Group     BP 12/03/20 1435 118/71     Pulse Rate 12/03/20 1435 66     Resp 12/03/20 1435 18     Temp 12/03/20 1435 97.9 F (36.6 C)     Temp Source 12/03/20 1435 Oral     SpO2 12/03/20 1435 97 %     Weight 12/03/20 1443 175 lb (79.4 kg)     Height 12/03/20 1443 5\' 3"  (1.6 m)     Head Circumference --      Peak Flow --      Pain Score 12/03/20 1442 10     Pain Loc --      Pain Edu? --      Excl. in [place]? --     Constitutional: Alert and oriented. Well appearing and  in no acute distress.  She is very calm appropriate pleasant. Eyes: Conjunctivae are normal. Head: Atraumatic. Nose: No congestion/rhinnorhea. Mouth/Throat: Mucous membranes are moist. Neck: No stridor.  Cardiovascular: Normal rate, regular rhythm. Grossly normal heart sounds.  Good peripheral circulation. Respiratory: Normal respiratory effort.  No retractions. Lungs CTAB. Gastrointestinal: Soft and nontender. No distention. Musculoskeletal: No lower extremity tenderness nor edema. Neurologic:  Normal speech and language. No gross focal neurologic  deficits are appreciated.  Normal motor and circulatory exam of the upper extremities bilateral. Skin:  Skin is warm, dry and intact. No rash noted. Psychiatric: Mood and affect are normal. Speech and behavior are normal.  ____________________________________________   LABS (all labs ordered are listed, but only abnormal results are displayed)  Labs Reviewed  BASIC METABOLIC PANEL - Abnormal; Notable for the following components:      Result Value   Glucose, Bld 128 (*)    Calcium 8.8 (*)    All other components within normal limits  CBC - Abnormal; Notable for the following components:   MCH 34.5 (*)    All other components within normal limits  POC URINE PREG, ED  TROPONIN I (HIGH SENSITIVITY)  TROPONIN I (HIGH SENSITIVITY)   ____________________________________________  EKG  ED ECG REPORT I, Sharyn Creamer, the attending physician, personally viewed and interpreted this ECG.  Date: 12/03/2020 EKG Time: 1435 Rate: 60 Rhythm: normal sinus rhythm QRS Axis: normal Intervals: normal ST/T Wave abnormalities: normal Narrative Interpretation: no evidence of acute ischemia, slightly short PR interval  ____________________________________________  RADIOLOGY  Sticks reviewed negative for acute finding ____________________________________________   PROCEDURES  Procedure(s) performed: None  Procedures  Critical Care  performed: No  ____________________________________________   INITIAL IMPRESSION / ASSESSMENT AND PLAN / ED COURSE  Pertinent labs & imaging results that were available during my care of the patient were reviewed by me and considered in my medical decision making (see chart for details).   HEAR Score: 4   Differential diagnosis includes, but is not limited to, ACS, aortic dissection, pulmonary embolism, cardiac tamponade, pneumothorax, pneumonia, pericarditis, myocarditis, GI-related causes including esophagitis/gastritis, and musculoskeletal chest wall pain.     No clinical symptoms such as ripping tearing or moving pain to suggest dissection.  No shortness of breath no tachycardia, no noted risk factors for pulmonary embolism.  Very reassuring clinical exam.  Suspect likely the patient may be suffering some type of a mild stress or anxiety as cause of her chest discomfort as her evaluation here demonstrates no evidence of cardiac ischemia and the clinical history seems suggestive of possible stress or anxiety, however certainly cannot exclude with certainty underlying ACS though very reassuring exam.  Troponins reassuring and normal.  Clinical exam chest x-ray etc. all reviewed.    Discussed further with the patient, she is comfortable with plan for follow-up, would be comfortable following up with cardiology and primary care.  I think is very appropriate.  She reports she feels comfortable relieved and improved after discussing her test results today.  Return precautions and treatment recommendations and follow-up discussed with the patient who is agreeable with the plan.       ____________________________________________   FINAL CLINICAL IMPRESSION(S) / ED DIAGNOSES  Final diagnoses:  Acute nonspecific chest pain with low risk of coronary artery disease        Note:  This document was prepared using Dragon voice recognition software and may include unintentional dictation  errors       Sharyn Creamer, MD 12/03/20 2137

## 2020-12-03 NOTE — ED Notes (Signed)
Pt called x 1 for troponin lab draw.  No answer by patient.

## 2020-12-03 NOTE — ED Triage Notes (Signed)
Pt arrives via pov ambulatory to triage. Pt c/o CP starting yesterday in center and right chest, described as a pressure. Pt reports intermittent numbness bilaterally in both arms but more on the left side. SOB increases with activity. Pt reports hx of heart murmer and leaking valve. Pt RR even and unlabored at this time and able to speak in full sentences. NAD noted at this time

## 2021-07-06 ENCOUNTER — Other Ambulatory Visit: Payer: Self-pay | Admitting: Family Medicine

## 2021-07-06 ENCOUNTER — Other Ambulatory Visit (HOSPITAL_COMMUNITY): Payer: Self-pay | Admitting: Family Medicine

## 2021-07-06 DIAGNOSIS — N939 Abnormal uterine and vaginal bleeding, unspecified: Secondary | ICD-10-CM

## 2021-07-07 ENCOUNTER — Ambulatory Visit
Admission: RE | Admit: 2021-07-07 | Discharge: 2021-07-07 | Disposition: A | Discharge status: Home / Self Care | Payer: Medicaid Other | Source: Ambulatory Visit | Attending: Family Medicine | Admitting: Family Medicine

## 2021-07-07 ENCOUNTER — Other Ambulatory Visit: Payer: Self-pay

## 2021-07-07 DIAGNOSIS — N939 Abnormal uterine and vaginal bleeding, unspecified: Secondary | ICD-10-CM | POA: Insufficient documentation

## 2021-07-12 ENCOUNTER — Telehealth: Payer: Self-pay

## 2021-07-12 NOTE — Telephone Encounter (Signed)
Patient refused our next available appointment ?

## 2021-07-30 ENCOUNTER — Encounter: Payer: Self-pay | Admitting: Gastroenterology

## 2021-08-04 ENCOUNTER — Ambulatory Visit: Payer: Medicaid Other | Admitting: Gastroenterology

## 2022-03-23 ENCOUNTER — Ambulatory Visit: Payer: Medicaid Other | Admitting: Family Medicine

## 2022-03-23 ENCOUNTER — Encounter: Payer: Self-pay | Admitting: Family Medicine

## 2022-03-23 NOTE — Progress Notes (Signed)
Patient did not keep appointment today. She may call to reschedule.  

## 2022-03-31 ENCOUNTER — Ambulatory Visit: Payer: Medicaid Other | Admitting: Advanced Practice Midwife

## 2022-04-07 ENCOUNTER — Encounter: Payer: Medicaid Other | Admitting: Obstetrics & Gynecology

## 2022-07-21 ENCOUNTER — Emergency Department
Admission: EM | Admit: 2022-07-21 | Discharge: 2022-07-21 | Disposition: A | Discharge status: Home / Self Care | Payer: Medicaid Other | Attending: Emergency Medicine | Admitting: Emergency Medicine

## 2022-07-21 ENCOUNTER — Other Ambulatory Visit: Payer: Self-pay

## 2022-07-21 ENCOUNTER — Emergency Department: Payer: Medicaid Other

## 2022-07-21 DIAGNOSIS — R4781 Slurred speech: Secondary | ICD-10-CM | POA: Diagnosis not present

## 2022-07-21 DIAGNOSIS — R202 Paresthesia of skin: Secondary | ICD-10-CM | POA: Insufficient documentation

## 2022-07-21 DIAGNOSIS — L299 Pruritus, unspecified: Secondary | ICD-10-CM | POA: Diagnosis not present

## 2022-07-21 DIAGNOSIS — I10 Essential (primary) hypertension: Secondary | ICD-10-CM | POA: Diagnosis not present

## 2022-07-21 DIAGNOSIS — K625 Hemorrhage of anus and rectum: Secondary | ICD-10-CM | POA: Diagnosis present

## 2022-07-21 DIAGNOSIS — R1011 Right upper quadrant pain: Secondary | ICD-10-CM | POA: Diagnosis not present

## 2022-07-21 LAB — CBC
HCT: 40.9 % (ref 36.0–46.0)
Hemoglobin: 13.5 g/dL (ref 12.0–15.0)
MCH: 31.5 pg (ref 26.0–34.0)
MCHC: 33 g/dL (ref 30.0–36.0)
MCV: 95.6 fL (ref 80.0–100.0)
Platelets: 224 10*3/uL (ref 150–400)
RBC: 4.28 MIL/uL (ref 3.87–5.11)
RDW: 13.3 % (ref 11.5–15.5)
WBC: 7.9 10*3/uL (ref 4.0–10.5)
nRBC: 0 % (ref 0.0–0.2)

## 2022-07-21 LAB — COMPREHENSIVE METABOLIC PANEL
ALT: 24 U/L (ref 0–44)
AST: 20 U/L (ref 15–41)
Albumin: 3.6 g/dL (ref 3.5–5.0)
Alkaline Phosphatase: 53 U/L (ref 38–126)
Anion gap: 11 (ref 5–15)
BUN: 14 mg/dL (ref 6–20)
CO2: 21 mmol/L — ABNORMAL LOW (ref 22–32)
Calcium: 8.8 mg/dL — ABNORMAL LOW (ref 8.9–10.3)
Chloride: 104 mmol/L (ref 98–111)
Creatinine, Ser: 0.73 mg/dL (ref 0.44–1.00)
GFR, Estimated: >60 mL/min (ref >60)
Glucose, Bld: 135 mg/dL — ABNORMAL HIGH (ref 70–99)
Potassium: 3.9 mmol/L (ref 3.5–5.1)
Sodium: 136 mmol/L (ref 135–145)
Total Bilirubin: 0.6 mg/dL (ref 0.3–1.2)
Total Protein: 7.1 g/dL (ref 6.5–8.1)

## 2022-07-21 LAB — TYPE AND SCREEN
ABO/RH(D): A POS
Antibody Screen: NEGATIVE

## 2022-07-21 LAB — TROPONIN I (HIGH SENSITIVITY): Troponin I (High Sensitivity): 3 ng/L (ref <18)

## 2022-07-21 LAB — TSH: TSH: 0.725 u[IU]/mL (ref 0.350–4.500)

## 2022-07-21 NOTE — ED Triage Notes (Signed)
PT c/o rectal bleeding x3 days and bilateral shoulder pain. Pt denies injury, denies blood thinners. Pt is AOX4, NAD noted. Pt c/o dizziness, SHOB, COPD and heart murmur hx. Pt also reports itchiness on head and neck for several days.

## 2022-07-21 NOTE — ED Provider Notes (Signed)
[facility] Provider Note    Event Date/Time   First MD Initiated Contact with Patient 07/21/22 1245     (approximate)   History   Rectal Bleeding   HPI  [name] is a 52 y.o. female with history of EtOH use, hypertension presents emergency department complaining of itching, slurred speech yesterday, tingling in the arms.  Concerned that she had a stroke yesterday.  Concerned about her liver due to her alcohol intake which is at least 2 beers per day.  Patient states she also smokes cigarettes.  Has concerned because she does not want take her blood pressure medication.  She denies chest pain or shortness of breath.  Some right upper quadrant pain but no vomiting.      Physical Exam   Triage Vital Signs: ED Triage Vitals  Enc Vitals Group     BP 07/21/22 1052 (!) 148/79     Pulse Rate 07/21/22 1052 78     Resp 07/21/22 1052 16     Temp 07/21/22 1052 98.1 F (36.7 C)     Temp src --      SpO2 07/21/22 1052 97 %     Weight --      Height 07/21/22 1112 5\' 4"  (1.626 m)     Head Circumference --      Peak Flow --      Pain Score 07/21/22 1112 7     Pain Loc --      Pain Edu? --      Excl. in [place]? --     Most recent vital signs: Vitals:   07/21/22 1052 07/21/22 1425  BP: (!) 148/79 125/70  Pulse: 78 60  Resp: 16 16  Temp: 98.1 F (36.7 C)   SpO2: 97% 100%     General: Awake, no distress.   CV:  Good peripheral perfusion. regular rate and  rhythm Resp:  Normal effort. Lungs cta Abd:  No distention.  Slight tenderness right upper quadrant Other:      ED Results / Procedures / Treatments   Labs (all labs ordered are listed, but only abnormal results are displayed) Labs Reviewed  COMPREHENSIVE METABOLIC PANEL - Abnormal; Notable for the following components:      Result Value   CO2 21 (*)    Glucose, Bld 135 (*)    Calcium 8.8 (*)    All other components within normal limits  CBC  TSH  VITAMIN B12  VITAMIN B1  POC  OCCULT BLOOD, ED  TYPE AND SCREEN  TROPONIN I (HIGH SENSITIVITY)  TROPONIN I (HIGH SENSITIVITY)     EKG  EKG   RADIOLOGY CT of the head Ultrasound right upper quadrant    PROCEDURES:   Procedures   MEDICATIONS ORDERED IN ED: Medications - No data to display   IMPRESSION / MDM / [place] / ED COURSE  I reviewed the triage vital signs and the nursing notes.                              Differential diagnosis includes, but is not limited to, CVA, subdural, subarachnoid, liver failure, acute cholecystitis, B12 deficiency  Patient's presentation is most consistent with acute presentation with potential threat to life or bodily function.   Patient's labs are reassuring CT of the head was independently reviewed and interpreted by me as being negative Ultrasound right upper quadrant was independently reviewed and interpreted by  me as being negative  I did explain these findings to the patient.  Told her that the TSH level, B12 and B1 would be resulting later today.  However when I discussed this with the lab patient already been discharged and they cannot do vitamin B1 and B12.  However they will continue with TSH.  The patient is in agreement with treatment plan.  I did discuss smoking cessation, EtOH use cessation.  Follow-up with her doctor or a new PCP for her blood pressure problems.  Patient is in agreement with her treatment plan.  Discharged in stable condition.   Lab discontinue the TSH.  Will contact patient to have her PCP evaluate her TSH level along with her vitamin B12 and B1.   FINAL CLINICAL IMPRESSION(S) / ED DIAGNOSES   Final diagnoses:  Rectal bleeding  Primary hypertension     Rx / DC Orders   ED Discharge Orders     None        Note:  This document was prepared using Dragon voice recognition software and may include unintentional dictation errors.    Versie Starks, PA-C 07/21/22 1917    Carrie Mew, MD 07/21/22  [phone]

## 2022-07-21 NOTE — Discharge Instructions (Addendum)
Please go to the following website to schedule new (and existing) patient appointments:   [email or web address]   The following is a list of primary care offices in the area who are accepting new patients at this time.  Please reach out to one of them directly and let them know you would like to schedule an appointment to follow up on an Emergency Department visit, and/or to establish a new primary care provider (PCP).  There are likely other primary care clinics in the are who are accepting new patients, but this is an excellent place to start:  [facility] Lead physician: Dr Angela Bacigalupo [address] [place], [place] [address] [phone]  [facility] Lead Physician: Dr Krichna Sowles [address], [place], [place] [address] [phone]  [facility]  Lead Physician: Dr Megan Johnson [address], [place], [place] [address] [phone]  [facility] Lead Physician: Dr Alex Karamalegos [address], [place], [place] [address] [phone]  [facility] [place] Lead Physician: Dr Laura Berglund [address], [place], [place] [address] [phone]   

## 2022-07-27 ENCOUNTER — Telehealth: Payer: Self-pay

## 2022-07-27 NOTE — Telephone Encounter (Signed)
[name] called triage line and left voicemail stating she's been calling since yesterday trying to get an ER follow up visit.   Please call her and get her scheduled

## 2022-07-28 NOTE — Telephone Encounter (Signed)
I contacted the patient for scheduled annual wellness exam. The patient states she was referred to [place] due to the patient having history with of practice for wellness and being referred for colonoscopy. The patient is aware that Dr. Gilman Schmidt is no longer with our office. The patient is going to contact [facility] for primary care because her provider with [facility] has retired. I have the patient scheduled for annual exam with Alicia Copland on [id number].

## 2022-08-23 ENCOUNTER — Ambulatory Visit (INDEPENDENT_AMBULATORY_CARE_PROVIDER_SITE_OTHER): Payer: Medicaid Other | Admitting: Obstetrics and Gynecology

## 2022-08-23 ENCOUNTER — Other Ambulatory Visit (HOSPITAL_COMMUNITY)
Admission: RE | Admit: 2022-08-23 | Discharge: 2022-08-23 | Disposition: A | Discharge status: Home / Self Care | Payer: Medicaid Other | Source: Ambulatory Visit | Attending: Obstetrics and Gynecology | Admitting: Obstetrics and Gynecology

## 2022-08-23 ENCOUNTER — Encounter: Payer: Self-pay | Admitting: Obstetrics and Gynecology

## 2022-08-23 VITALS — BP 130/85 | HR 73 | Wt 212.0 lb

## 2022-08-23 DIAGNOSIS — R32 Unspecified urinary incontinence: Secondary | ICD-10-CM

## 2022-08-23 DIAGNOSIS — Z01411 Encounter for gynecological examination (general) (routine) with abnormal findings: Secondary | ICD-10-CM

## 2022-08-23 NOTE — Progress Notes (Signed)
Subjective:     [name] is a 52 y.o. female postmenopausal with BMI 36 who is here for a comprehensive physical exam. The patient reports urinary and fecal incontinence. She states that this has been ongoing for several years but the fecal incontinence started a few months ago. She reports a history of wearing a pessary but stopped using it. She reports some vaginal burning. She reports a recent diagnosis of internal hemorrhoids causing some occasional rectal bleeding. Her most bothersome symptoms are the incontinence which significantly interferes with her daily living. She is sexually active without pain. She denies any history of postmenopausal vaginal bleeding. Patient is without any other complaints  Past Medical History:  Diagnosis Date   Allergy    Asthma    Bladder disorder    Bladder infection    Constipation    Eczema    Endometriosis    Ovarian cyst    UTI (lower urinary tract infection)    Past Surgical History:  Procedure Laterality Date   BLADDER SURGERY     LAPAROSCOPY     TUBAL LIGATION     Family History  Problem Relation Age of Onset   Heart disease Father    GER disease Father    Hypertension Father    GER disease Mother    GER disease Brother    GER disease Sister     Social History   Socioeconomic History   Marital status: Legally Separated    Spouse name: Not on file   Number of children: Not on file   Years of education: Not on file   Highest education level: Not on file  Occupational History   Not on file  Tobacco Use   Smoking status: Every Day    Packs/day: 0.50    Years: 20.00    Additional pack years: 0.00    Total pack years: 10.00    Types: Cigarettes   Smokeless tobacco: Never  Vaping Use   Vaping Use: Never used  Substance and Sexual Activity   Alcohol use: Yes    Alcohol/week: 4.0 standard drinks of alcohol    Types: 2 Glasses of wine, 2 Cans of beer per week    Comment: 3 x per week   Drug use: No   Sexual  activity: Not Currently    Birth control/protection: Surgical  Other Topics Concern   Not on file  Social History Narrative   Not on file   Social Determinants of Health   Financial Resource Strain: Not on file  Food Insecurity: Not on file  Transportation Needs: Not on file  Physical Activity: Not on file  Stress: Not on file  Social Connections: Not on file  Intimate Partner Violence: Not on file   Health Maintenance  Topic Date Due   COVID-19 Vaccine (1) Never done   HIV Screening  Never done   Hepatitis C Screening  Never done   DTaP/Tdap/Td (1 - Tdap) Never done   COLONOSCOPY (Pts 45-28yrs Insurance coverage will need to be confirmed)  Never done   MAMMOGRAM  01/11/2020   Zoster Vaccines- Shingrix (1 of 2) Never done   PAP SMEAR-Modifier  05/08/2022   INFLUENZA VACCINE  12/01/2022   HPV VACCINES  Aged Out    Review of Systems Pertinent items noted in HPI and remainder of comprehensive ROS otherwise negative.   Objective:  Blood pressure 130/85, pulse 73, weight 212 lb (96.2 kg).   GENERAL: Well-developed, well-nourished female in no acute distress.  HEENT: Normocephalic, atraumatic. Sclerae anicteric.  NECK: Supple. Normal thyroid.  LUNGS: Clear to auscultation bilaterally.  HEART: Regular rate and rhythm. BREASTS: Symmetric in size. No palpable masses or lymphadenopathy, skin changes, or nipple drainage. ABDOMEN: Soft, nontender, nondistended. No organomegaly. PELVIC: Normal external female genitalia. Vagina is pink and rugated.  Normal discharge. Normal appearing cervix. Uterus is normal in size. No adnexal mass or tenderness. Chaperone present during the pelvic exam EXTREMITIES: No cyanosis, clubbing, or edema, 2+ distal pulses.     Assessment:    Healthy female exam.      Plan:    Pap smear collected Screening mammogram ordered Vaginal swab collected Patient referred for colonoscopy Patient referred to urogynecology Patient will be contacted with  abnormal results See After Visit Summary for Counseling Recommendations

## 2022-08-23 NOTE — Progress Notes (Signed)
CC: urinary incontinence, Abdominal pain on and off x years  Hx of Tubal ligation  Hx of cyst of ovaries  Hx of pessary- no longer wears it  Urinating and defecation at the same time x 1-2 months, does interact with sexual intercourse  Pain and burning with sexual intercourse    Mammogram- not up to date  Colonoscopy- Not up to date Pap: 2021  Hx of abnormal : unsure of HX of abnormal pap

## 2022-08-25 LAB — CERVICOVAGINAL ANCILLARY ONLY
Bacterial Vaginitis (gardnerella): NEGATIVE
Candida Glabrata: NEGATIVE
Candida Vaginitis: NEGATIVE
Chlamydia: NEGATIVE
Comment: NEGATIVE
Comment: NEGATIVE
Comment: NEGATIVE
Comment: NEGATIVE
Comment: NEGATIVE
Comment: NORMAL
Neisseria Gonorrhea: NEGATIVE
Trichomonas: NEGATIVE

## 2022-08-29 LAB — CYTOLOGY - PAP
Adequacy: ABSENT
Diagnosis: NEGATIVE

## 2022-08-30 ENCOUNTER — Other Ambulatory Visit: Payer: Self-pay | Admitting: *Deleted

## 2022-08-30 ENCOUNTER — Telehealth: Payer: Self-pay

## 2022-08-30 DIAGNOSIS — Z1211 Encounter for screening for malignant neoplasm of colon: Secondary | ICD-10-CM

## 2022-08-30 NOTE — Telephone Encounter (Signed)
Patient is calling because her provider told her that we tried to reach out to her yesterday but we were unable to leave a message. She states she knows her voicemail is full and does not answer a number if she does not recognize it. She states she wants to schedule her colonoscopy and will answer our phone call.

## 2022-08-30 NOTE — Telephone Encounter (Signed)
Message left for patient to return my call.  

## 2022-08-30 NOTE — Telephone Encounter (Signed)
I have called patient and she was concern if she should be getting the colonoscopy.  She would like the advise from a female provider. She was told by her OB/GYN provider that her bladder prolapse and may be getting surgery.  Okay to preceded?

## 2022-08-30 NOTE — Telephone Encounter (Addendum)
During our fist phone call, patient already requested to schedule with Dr Allegra Lai on 10/03/2022. I went ahead and schedule for patient as request but we need to discuss something.

## 2022-08-31 ENCOUNTER — Telehealth: Payer: Self-pay

## 2022-08-31 MED ORDER — NA SULFATE-K SULFATE-MG SULF 17.5-3.13-1.6 GM/177ML PO SOLN: 1.0000 | Freq: Once | ORAL | 0 refills | Status: AC

## 2022-08-31 NOTE — Telephone Encounter (Signed)
Gastroenterology Pre-Procedure Review  Request Date: 10/03/2022 Requesting Physician: Dr. Allegra Lai  PATIENT REVIEW QUESTIONS: The patient responded to the following health history questions as indicated:    1. Are you having any GI issues? no 2. Do you have a personal history of Polyps? no 3. Do you have a family history of Colon Cancer or Polyps? no 4. Diabetes Mellitus? no 5. Joint replacements in the past 12 months?no 6. Major health problems in the past 3 months?no 7. Any artificial heart valves, MVP, or defibrillator?no    MEDICATIONS & ALLERGIES:    Patient reports the following regarding taking any anticoagulation/antiplatelet therapy:   Plavix, Coumadin, Eliquis, Xarelto, Lovenox, Pradaxa, Brilinta, or Effient? no Aspirin? no  Patient confirms/reports the following medications:  Current Outpatient Medications  Medication Sig Dispense Refill   Cholecalciferol (VITAMIN D3) 1.25 MG (50000 UT) CAPS Take 1 capsule by mouth every 30 (thirty) days. (Patient not taking: Reported on 08/23/2022)     furosemide (LASIX) 20 MG tablet Take 1 tablet (20 mg total) by mouth daily for 7 days. 7 tablet 0   hydrochlorothiazide (HYDRODIURIL) 12.5 MG tablet Take 12.5 mg by mouth daily. (Patient not taking: Reported on 08/23/2022)     ibuprofen (ADVIL) 600 MG tablet Take by mouth. (Patient not taking: Reported on 08/23/2022)     medroxyPROGESTERone (PROVERA) 10 MG tablet Take 1 tablet (10 mg total) by mouth daily. (Patient not taking: Reported on 08/23/2022) 10 tablet 0   No current facility-administered medications for this visit.    Patient confirms/reports the following allergies:  Allergies  Allergen Reactions   Codeine Anaphylaxis    Shortness of breath   Elemental Sulfur Rash   Penicillins Itching and Other (See Comments)    Causes yeast infections palpatations    Sulfa Antibiotics Rash   Sulfamethoxazole Itching and Other (See Comments)    Causes yeast infections    No orders of the  defined types were placed in this encounter.   AUTHORIZATION INFORMATION Primary Insurance: 1D#: Group #:  Secondary Insurance: 1D#: Group #:  SCHEDULE INFORMATION: Date: 10/03/2022 Time: Location: [facility]

## 2022-08-31 NOTE — Telephone Encounter (Signed)
Per [facility] website policy will terminate on 09-30-2022 call pt left message to return call to [phone]     Patient name [facility] Member number [id number] S Group number [facility] Product - Relationship Employee Effective date 06/02/2022  Termination date 09/30/2022 Insurance type Medicaid   [employer]  will not approve 10-03-2022 procedure      The communication was added successfully.   Close

## 2022-08-31 NOTE — Addendum Note (Signed)
Addended by: Tawnya Crook on: 08/31/2022 10:53 AM   Modules accepted: Orders

## 2022-09-01 ENCOUNTER — Telehealth: Payer: Self-pay

## 2022-09-01 NOTE — Telephone Encounter (Signed)
Per pt has call her social worker to see if her insurance will terminate on 09-30-2022 . Per [facility] insurance will close on 09-30-2022.

## 2022-09-05 ENCOUNTER — Other Ambulatory Visit: Payer: Self-pay | Admitting: Obstetrics and Gynecology

## 2022-09-05 MED ORDER — ONDANSETRON HCL 4 MG PO TABS: 4.0000 mg | ORAL_TABLET | Freq: Three times a day (TID) | ORAL | 0 refills | Status: DC | PRN

## 2022-09-05 MED ORDER — VITAMIN D3 1.25 MG (50000 UT) PO CAPS: 1.0000 | ORAL_CAPSULE | ORAL | 6 refills | Status: DC

## 2022-09-08 ENCOUNTER — Ambulatory Visit: Payer: Medicaid Other | Admitting: Obstetrics and Gynecology

## 2022-09-28 ENCOUNTER — Telehealth: Payer: Self-pay | Admitting: *Deleted

## 2022-09-28 NOTE — Telephone Encounter (Signed)
Patient contact Endo Unit and one of the nurse contact me to call patient.  Patient have to reschedule to due it run in to their vacation and it will be cutting it too close to what she wanted.  Requesting reschedule to 11/28/2022 because she wanted the next available Monday with Dr Allegra Lai.  New instructions will be sent. Patient will call [facility] regarding the suprep.  Called and made the change with Trish at endo unit.

## 2022-11-07 ENCOUNTER — Telehealth: Payer: Self-pay

## 2022-11-07 NOTE — Telephone Encounter (Signed)
TC to pt to make aware to keep scheduled appt for colonoscopy /evaluation then will move forward from there.   Pt walked in with concerns on recent referral to Urogyn and c/o "Bulging Bowel"

## 2022-11-14 ENCOUNTER — Emergency Department
Admission: EM | Admit: 2022-11-14 | Discharge: 2022-11-14 | Disposition: A | Discharge status: Home / Self Care | Payer: Medicaid Other | Source: Home / Self Care | Attending: Emergency Medicine | Admitting: Emergency Medicine

## 2022-11-14 ENCOUNTER — Emergency Department: Payer: Medicaid Other

## 2022-11-14 ENCOUNTER — Other Ambulatory Visit: Payer: Self-pay

## 2022-11-14 DIAGNOSIS — I10 Essential (primary) hypertension: Secondary | ICD-10-CM | POA: Diagnosis present

## 2022-11-14 MED ORDER — VENTOLIN HFA 108 (90 BASE) MCG/ACT IN AERS: 2.0000 | INHALATION_SPRAY | Freq: Four times a day (QID) | RESPIRATORY_TRACT | 2 refills | Status: DC | PRN

## 2022-11-14 MED ORDER — IPRATROPIUM-ALBUTEROL 0.5-2.5 (3) MG/3ML IN SOLN
3.0000 mL | Freq: Once | RESPIRATORY_TRACT | Status: AC
  Administered 2022-11-14: 3 mL via RESPIRATORY_TRACT
  Filled 2022-11-14: qty 3

## 2022-11-14 NOTE — ED Notes (Signed)
See triage notes. Patient stated she has a history of COPD.

## 2022-11-14 NOTE — ED Triage Notes (Signed)
Pt comes via [facility] from home with c/o HTN. Pt states yesterday N/V. Pt states flushed feeling. Pt states today she jsut feels her pressure is up. Pt does have hx but no meds.

## 2022-11-14 NOTE — ED Provider Notes (Signed)
[facility] Provider Note    Event Date/Time   First MD Initiated Contact with Patient 11/14/22 1036     (approximate)   History   Hypertension   HPI  TANICA GAIGE is a 52 y.o. female who presents with complaints of elevated blood pressure, patient reports she felt flushed feels like her blood pressure is elevated.  She denies chest pain.  Had an episode of nausea yesterday, none today.  No diaphoresis, no palpitations     Physical Exam   Triage Vital Signs: ED Triage Vitals  Encounter Vitals Group     BP 11/14/22 0931 134/81     Systolic BP Percentile --      Diastolic BP Percentile --      Pulse Rate 11/14/22 0931 74     Resp 11/14/22 0931 18     Temp 11/14/22 0931 98 F (36.7 C)     Temp src --      SpO2 11/14/22 0931 100 %     Weight 11/14/22 1024 96.2 kg (212 lb 1.3 oz)     Height 11/14/22 1024 1.626 m (5\' 4" )     Head Circumference --      Peak Flow --      Pain Score 11/14/22 0931 0     Pain Loc --      Pain Education --      Exclude from Growth Chart --     Most recent vital signs: Vitals:   11/14/22 0931  BP: 134/81  Pulse: 74  Resp: 18  Temp: 98 F (36.7 C)  SpO2: 100%     General: Awake, no distress.  CV:  Good peripheral perfusion.  Resp:  Normal effort.  Abd:  No distention.  Other:     ED Results / Procedures / Treatments   Labs (all labs ordered are listed, but only abnormal results are displayed) Labs Reviewed - No data to display   EKG     RADIOLOGY Chest x-ray viewed interpret by me, no acute abnormality    PROCEDURES:  Critical Care performed:   Procedures   MEDICATIONS ORDERED IN ED: Medications  ipratropium-albuterol (DUONEB) 0.5-2.5 (3) MG/3ML nebulizer solution 3 mL (3 mLs Nebulization Given 11/14/22 1101)     IMPRESSION / MDM / ASSESSMENT AND PLAN / ED COURSE  I reviewed the triage vital signs and the nursing notes. Patient's presentation is most consistent with acute  complicated illness / injury requiring diagnostic workup.   Patient presents with feeling of elevated blood pressure, overall well-appearing and in no acute distress.  Blood pressure is unremarkable here.  She does not have any chest pain or palpitations.  No indication for labs at this time.  No indication for admission, appropriate for discharge with outpatient follow-up       FINAL CLINICAL IMPRESSION(S) / ED DIAGNOSES   Final diagnoses:  Hypertension, unspecified type     Rx / DC Orders   ED Discharge Orders          Ordered    Ambulatory Referral to Primary Care (Establish Care)        11/14/22 1121    albuterol (VENTOLIN HFA) 108 (90 Base) MCG/ACT inhaler  Every 6 hours PRN        11/14/22 1122             Note:  This document was prepared using Dragon voice recognition software and may include unintentional dictation errors.   Jene Every, MD 11/14/22  1710  

## 2022-11-23 ENCOUNTER — Telehealth: Payer: Self-pay | Admitting: Gastroenterology

## 2022-11-23 NOTE — Telephone Encounter (Signed)
Patient has some questions about her instructions on what she can and can't have before her procedure.

## 2022-11-23 NOTE — Telephone Encounter (Signed)
Patient have spoken to Ginger regarding her concerns.

## 2022-11-25 ENCOUNTER — Encounter: Payer: Self-pay | Admitting: Gastroenterology

## 2022-11-28 ENCOUNTER — Encounter
Admission: RE | Disposition: A | Discharge status: Home / Self Care | Payer: Self-pay | Source: Ambulatory Visit | Attending: Gastroenterology

## 2022-11-28 ENCOUNTER — Other Ambulatory Visit: Payer: Self-pay

## 2022-11-28 ENCOUNTER — Ambulatory Visit
Admission: RE | Admit: 2022-11-28 | Discharge: 2022-11-28 | Disposition: A | Discharge status: Home / Self Care | Payer: Medicaid Other | Source: Ambulatory Visit | Attending: Gastroenterology | Admitting: Gastroenterology

## 2022-11-28 ENCOUNTER — Encounter: Payer: Self-pay | Admitting: Gastroenterology

## 2022-11-28 ENCOUNTER — Ambulatory Visit: Payer: Medicaid Other | Admitting: Certified Registered"

## 2022-11-28 DIAGNOSIS — F17210 Nicotine dependence, cigarettes, uncomplicated: Secondary | ICD-10-CM | POA: Diagnosis not present

## 2022-11-28 DIAGNOSIS — K5730 Diverticulosis of large intestine without perforation or abscess without bleeding: Secondary | ICD-10-CM | POA: Diagnosis not present

## 2022-11-28 DIAGNOSIS — J449 Chronic obstructive pulmonary disease, unspecified: Secondary | ICD-10-CM | POA: Insufficient documentation

## 2022-11-28 DIAGNOSIS — Z1211 Encounter for screening for malignant neoplasm of colon: Secondary | ICD-10-CM

## 2022-11-28 DIAGNOSIS — Z6835 Body mass index (BMI) 35.0-35.9, adult: Secondary | ICD-10-CM | POA: Diagnosis not present

## 2022-11-28 DIAGNOSIS — J45909 Unspecified asthma, uncomplicated: Secondary | ICD-10-CM | POA: Insufficient documentation

## 2022-11-28 DIAGNOSIS — K219 Gastro-esophageal reflux disease without esophagitis: Secondary | ICD-10-CM | POA: Diagnosis not present

## 2022-11-28 HISTORY — PX: COLONOSCOPY WITH PROPOFOL: SHX5780

## 2022-11-28 SURGERY — COLONOSCOPY WITH PROPOFOL
Anesthesia: General

## 2022-11-28 MED ORDER — PROPOFOL 10 MG/ML IV BOLUS
INTRAVENOUS | Status: DC | PRN
  Administered 2022-11-28 (×2): 10 mg via INTRAVENOUS
  Administered 2022-11-28: 20 mg via INTRAVENOUS
  Administered 2022-11-28: 60 mg via INTRAVENOUS

## 2022-11-28 MED ORDER — MIDAZOLAM HCL 2 MG/2ML IJ SOLN
INTRAMUSCULAR | Status: DC | PRN
  Administered 2022-11-28: 2 mg via INTRAVENOUS

## 2022-11-28 MED ORDER — GLYCOPYRROLATE 0.2 MG/ML IJ SOLN
INTRAMUSCULAR | Status: DC | PRN
  Administered 2022-11-28: .2 mg via INTRAVENOUS

## 2022-11-28 MED ORDER — SODIUM CHLORIDE 0.9 % IV SOLN: INTRAVENOUS | Status: DC

## 2022-11-28 MED ORDER — MIDAZOLAM HCL 2 MG/2ML IJ SOLN
INTRAMUSCULAR | Status: AC
  Filled 2022-11-28: qty 2

## 2022-11-28 MED ORDER — PROPOFOL 500 MG/50ML IV EMUL
INTRAVENOUS | Status: DC | PRN
  Administered 2022-11-28: 165 ug/kg/min via INTRAVENOUS

## 2022-11-28 MED ORDER — LIDOCAINE HCL (CARDIAC) PF 100 MG/5ML IV SOSY
PREFILLED_SYRINGE | INTRAVENOUS | Status: DC | PRN
  Administered 2022-11-28: 100 mg via INTRAVENOUS

## 2022-11-28 MED ORDER — PHENYLEPHRINE 80 MCG/ML (10ML) SYRINGE FOR IV PUSH (FOR BLOOD PRESSURE SUPPORT)
PREFILLED_SYRINGE | INTRAVENOUS | Status: DC | PRN
  Administered 2022-11-28: 160 ug via INTRAVENOUS

## 2022-11-28 NOTE — H&P (Signed)
Arlyss Repress, MD [address]  [address]  [place], [place] [address]  Main: [phone]  Fax: [phone] Pager: [phone]  Primary Care Physician:  [facility] Primary Gastroenterologist:  Dr. Arlyss Repress  Pre-Procedure History & Physical: HPI:  [name] is a 52 y.o. female is here for an colonoscopy.   Past Medical History:  Diagnosis Date   Allergy    Asthma    Bladder disorder    Bladder infection    Constipation    Eczema    Endometriosis    Ovarian cyst    UTI (lower urinary tract infection)     Past Surgical History:  Procedure Laterality Date   BLADDER SURGERY     LAPAROSCOPY     TUBAL LIGATION      Prior to Admission medications   Medication Sig Start Date End Date Taking? Authorizing Provider  ondansetron (ZOFRAN) 4 MG tablet Take 1 tablet (4 mg total) by mouth every 8 (eight) hours as needed for nausea or vomiting. 09/05/22   Constant, Peggy, MD  albuterol (VENTOLIN HFA) 108 (90 Base) MCG/ACT inhaler Inhale 2 puffs into the lungs every 6 (six) hours as needed for wheezing or shortness of breath. 11/14/22   Jene Every, MD  Cholecalciferol (VITAMIN D3) 1.25 MG (50000 UT) CAPS Take 1 capsule (1.25 mg total) by mouth every 30 (thirty) days. 09/05/22   Constant, Peggy, MD  furosemide (LASIX) 20 MG tablet Take 1 tablet (20 mg total) by mouth daily for 7 days. 11/28/17 12/05/17  Emily Filbert, MD  hydrochlorothiazide (HYDRODIURIL) 12.5 MG tablet Take 12.5 mg by mouth daily. Patient not taking: Reported on 08/23/2022 09/02/20   [provider]  ibuprofen (ADVIL) 600 MG tablet Take by mouth. Patient not taking: Reported on 08/23/2022    [provider]  medroxyPROGESTERone (PROVERA) 10 MG tablet Take 1 tablet (10 mg total) by mouth daily. Patient not taking: Reported on 08/23/2022 09/02/20   Natale Milch, MD    Allergies as of 08/30/2022 - Review Complete 08/23/2022  Allergen Reaction Noted   Codeine  Anaphylaxis 11/01/2011   Elemental sulfur Rash 04/05/2012   Penicillins Itching and Other (See Comments) 03/21/2012   Sulfa antibiotics Rash 02/03/2011   Sulfamethoxazole Itching and Other (See Comments) 03/21/2012    Family History  Problem Relation Age of Onset   Heart disease Father    GER disease Father    Hypertension Father    GER disease Mother    GER disease Brother    GER disease Sister     Social History   Socioeconomic History   Marital status: Legally Separated    Spouse name: Not on file   Number of children: Not on file   Years of education: Not on file   Highest education level: Not on file  Occupational History   Not on file  Tobacco Use   Smoking status: Every Day    Current packs/day: 0.50    Average packs/day: 0.5 packs/day for 20.0 years (10.0 ttl pk-yrs)    Types: Cigarettes   Smokeless tobacco: Never  Vaping Use   Vaping status: Never Used  Substance and Sexual Activity   Alcohol use: Yes    Alcohol/week: 4.0 standard drinks of alcohol    Types: 2 Glasses of wine, 2 Cans of beer per week    Comment: 3 x per week   Drug use: No   Sexual activity: Not Currently    Birth control/protection: Surgical  Other Topics Concern   Not on file  Social History Narrative   Not on file   Social Determinants of Health   Financial Resource Strain: Not on file  Food Insecurity: Not on file  Transportation Needs: Not on file  Physical Activity: Not on file  Stress: Not on file  Social Connections: Not on file  Intimate Partner Violence: Not on file    Review of Systems: See HPI, otherwise negative ROS  Physical Exam: BP 118/77   Pulse 64   Temp (!) 96.1 F (35.6 C) (Temporal)   Resp 18   Wt 93.9 kg   SpO2 98%   BMI 35.53 kg/m  General:   Alert,  pleasant and cooperative in NAD Head:  Normocephalic and atraumatic. Neck:  Supple; no masses or thyromegaly. Lungs:  Clear throughout to auscultation.    Heart:  Regular rate and  rhythm. Abdomen:  Soft, nontender and nondistended. Normal bowel sounds, without guarding, and without rebound.   Neurologic:  Alert and  oriented x4;  grossly normal neurologically.  Impression/Plan: [name] is here for an colonoscopy to be performed for colon cancer screening  Risks, benefits, limitations, and alternatives regarding  colonoscopy have been reviewed with the patient.  Questions have been answered.  All parties agreeable.   Lannette Donath, MD  11/28/2022, 9:34 AM

## 2022-11-28 NOTE — Anesthesia Preprocedure Evaluation (Signed)
Anesthesia Evaluation  Patient identified by MRN, date of birth, ID band Patient awake    Reviewed: Allergy & Precautions, NPO status , Patient's Chart, lab work & pertinent test results  Airway Mallampati: III  TM Distance: >3 FB Neck ROM: full    Dental  (+) Teeth Intact   Pulmonary neg pulmonary ROS, COPD, Current Smoker and Patient abstained from smoking.   Pulmonary exam normal  + decreased breath sounds      Cardiovascular Exercise Tolerance: Good negative cardio ROS Normal cardiovascular exam Rhythm:Regular Rate:Normal     Neuro/Psych TIAnegative neurological ROS  negative psych ROS   GI/Hepatic negative GI ROS, Neg liver ROS,GERD  Medicated,,  Endo/Other  negative endocrine ROS  Morbid obesity  Renal/GU negative Renal ROS  negative genitourinary   Musculoskeletal   Abdominal  (+) + obese  Peds negative pediatric ROS (+)  Hematology negative hematology ROS (+)   Anesthesia Other Findings Past Medical History: No date: Allergy No date: Asthma No date: Bladder disorder No date: Bladder infection No date: Constipation No date: Eczema No date: Endometriosis No date: Ovarian cyst No date: UTI (lower urinary tract infection)  Past Surgical History: No date: BLADDER SURGERY No date: LAPAROSCOPY No date: TUBAL LIGATION  BMI    Body Mass Index: 35.53 kg/m      Reproductive/Obstetrics negative OB ROS                             Anesthesia Physical Anesthesia Plan  ASA: 3  Anesthesia Plan: General   Post-op Pain Management:    Induction: Intravenous  PONV Risk Score and Plan: Propofol infusion and TIVA  Airway Management Planned: Natural Airway  Additional Equipment:   Intra-op Plan:   Post-operative Plan:   Informed Consent: I have reviewed the patients History and Physical, chart, labs and discussed the procedure including the risks, benefits and alternatives  for the proposed anesthesia with the patient or authorized representative who has indicated his/her understanding and acceptance.     Dental Advisory Given  Plan Discussed with: CRNA and Surgeon  Anesthesia Plan Comments:        Anesthesia Quick Evaluation

## 2022-11-28 NOTE — Op Note (Signed)
[facility] Gastroenterology Patient Name: [name] Procedure Date: 11/28/2022 9:34 AM MRN: [id number] Account #: [id number] Date of Birth: 07-12-1970 Admit Type: Outpatient Age: 52 Room: [facility] ENDO ROOM 4 Gender: Female Note Status: Finalized Instrument Name: Prentice Docker [id number] Procedure:             Colonoscopy Indications:           Screening for colorectal malignant neoplasm, This is                         the patient's first colonoscopy Providers:             Toney Reil MD, MD Referring MD:          No Local Md, MD (Referring MD) Medicines:             General Anesthesia Complications:         No immediate complications. Estimated blood loss: None. Procedure:             Pre-Anesthesia Assessment:                        - Prior to the procedure, a History and Physical was                         performed, and patient medications and allergies were                         reviewed. The patient is competent. The risks and                         benefits of the procedure and the sedation options and                         risks were discussed with the patient. All questions                         were answered and informed consent was obtained.                         Patient identification and proposed procedure were                         verified by the physician, the nurse, the                         anesthesiologist, the anesthetist and the technician                         in the pre-procedure area in the procedure room in the                         endoscopy suite. Mental Status Examination: alert and                         oriented. Airway Examination: normal oropharyngeal                         airway and neck mobility. Respiratory Examination:  clear to auscultation. CV Examination: normal.                         Prophylactic Antibiotics: The patient does not require                          prophylactic antibiotics. Prior Anticoagulants: The                         patient has taken no anticoagulant or antiplatelet                         agents. ASA Grade Assessment: III - A patient with                         severe systemic disease. After reviewing the risks and                         benefits, the patient was deemed in satisfactory                         condition to undergo the procedure. The anesthesia                         plan was to use general anesthesia. Immediately prior                         to administration of medications, the patient was                         re-assessed for adequacy to receive sedatives. The                         heart rate, respiratory rate, oxygen saturations,                         blood pressure, adequacy of pulmonary ventilation, and                         response to care were monitored throughout the                         procedure. The physical status of the patient was                         re-assessed after the procedure.                        After obtaining informed consent, the colonoscope was                         passed under direct vision. Throughout the procedure,                         the patient's blood pressure, pulse, and oxygen                         saturations were monitored continuously. The  Colonoscope was introduced through the anus and                         advanced to the the cecum, identified by appendiceal                         orifice and ileocecal valve. The colonoscopy was                         performed without difficulty. The patient tolerated                         the procedure well. The quality of the bowel                         preparation was evaluated using the BBPS [facility] Bowel                         Preparation Scale) with scores of: Right Colon = 3,                         Transverse Colon = 3 and Left Colon = 3 (entire mucosa                          seen well with no residual staining, small fragments                         of stool or opaque liquid). The total BBPS score                         equals 9. The ileocecal valve, appendiceal orifice,                         and rectum were photographed. Findings:      The perianal and digital rectal examinations were normal. Pertinent       negatives include normal sphincter tone and no palpable rectal lesions.      Multiple diverticula were found in the recto-sigmoid colon and sigmoid       colon.      The retroflexed view of the distal rectum and anal verge was normal and       showed no anal or rectal abnormalities.      The exam was otherwise without abnormality. Impression:            - Diverticulosis in the recto-sigmoid colon and in the                         sigmoid colon.                        - The distal rectum and anal verge are normal on                         retroflexion view.                        - The examination was otherwise normal.                        -  No specimens collected. Recommendation:        - Discharge patient to home (with escort).                        - Resume previous diet today.                        - Continue present medications.                        - Repeat colonoscopy in 10 years for screening                         purposes. Procedure Code(s):     --- Professional ---                        B2841, Colorectal cancer screening; colonoscopy on                         individual not meeting criteria for high risk Diagnosis Code(s):     --- Professional ---                        Z12.11, Encounter for screening for malignant neoplasm                         of colon                        K57.30, Diverticulosis of large intestine without                         perforation or abscess without bleeding CPT copyright 2022 American Medical Association. All rights reserved. The codes documented in this report are preliminary and  upon coder review may  be revised to meet current compliance requirements. Dr. Libby Maw Toney Reil MD, MD 11/28/2022 10:05:07 AM This report has been signed electronically. Number of Addenda: 0 Note Initiated On: 11/28/2022 9:34 AM Scope Withdrawal Time: 0 hours 7 minutes 23 seconds  Total Procedure Duration: 0 hours 10 minutes 7 seconds  Estimated Blood Loss:  Estimated blood loss: none.      [facility]

## 2022-11-28 NOTE — Anesthesia Postprocedure Evaluation (Signed)
Anesthesia Post Note  Patient: [name]  Procedure(s) Performed: COLONOSCOPY WITH PROPOFOL  Patient location during evaluation: PACU Anesthesia Type: General Level of consciousness: awake and awake and alert Pain management: satisfactory to patient Vital Signs Assessment: post-procedure vital signs reviewed and stable Respiratory status: nonlabored ventilation and respiratory function stable Cardiovascular status: blood pressure returned to baseline Anesthetic complications: no   No notable events documented.   Last Vitals:  Vitals:   11/28/22 1016 11/28/22 1026  BP: 104/62 119/69  Pulse: 68 73  Resp: 19 19  Temp:    SpO2: 100% 100%    Last Pain:  Vitals:   11/28/22 1026  TempSrc:   PainSc: 0-No pain                 VAN STAVEREN,Zachary Nole

## 2022-11-28 NOTE — Transfer of Care (Signed)
Immediate Anesthesia Transfer of Care Note  Patient: [name] [facility]  Procedure(s) Performed: COLONOSCOPY WITH PROPOFOL  Patient Location: Endoscopy Unit  Anesthesia Type:General  Level of Consciousness: drowsy and patient cooperative  Airway & Oxygen Therapy: Patient Spontanous Breathing and Patient connected to face mask oxygen  Post-op Assessment: Report given to RN and Post -op Vital signs reviewed and stable  Post vital signs: Reviewed and stable  Last Vitals:  Vitals Value Taken Time  BP 86/64 11/28/22 1009  Temp 35.6 C 11/28/22 1006  Pulse 73 11/28/22 1010  Resp 19 11/28/22 1010  SpO2 100 % 11/28/22 1010  Vitals shown include unfiled device data.  Last Pain:  Vitals:   11/28/22 1006  TempSrc: Tympanic  PainSc: Asleep         Complications: No notable events documented.

## 2022-11-28 NOTE — Anesthesia Procedure Notes (Signed)
Procedure Name: General with mask airway Date/Time: 11/28/2022 9:50 AM  Performed by: Mohammed Kindle, CRNAPre-anesthesia Checklist: Patient identified, Emergency Drugs available, Suction available, Timeout performed and Patient being monitored Patient Re-evaluated:Patient Re-evaluated prior to induction Oxygen Delivery Method: Simple face mask Induction Type: IV induction Placement Confirmation: positive ETCO2, CO2 detector and breath sounds checked- equal and bilateral Dental Injury: Teeth and Oropharynx as per pre-operative assessment

## 2022-11-29 ENCOUNTER — Other Ambulatory Visit: Payer: Self-pay | Admitting: Obstetrics and Gynecology

## 2022-11-29 ENCOUNTER — Encounter: Payer: Self-pay | Admitting: Gastroenterology

## 2022-12-09 IMAGING — US US PELVIS COMPLETE WITH TRANSVAGINAL
1 series · 13 of 25 positions shown · non-contrast
Comparison: None

CLINICAL DATA: Right lower quadrant pain.  Vaginal bleeding.



[Series 1: us pelvis complete with transvaginal · 0.24mm/px · 13 of 118 slices shown]
[im 1/118]
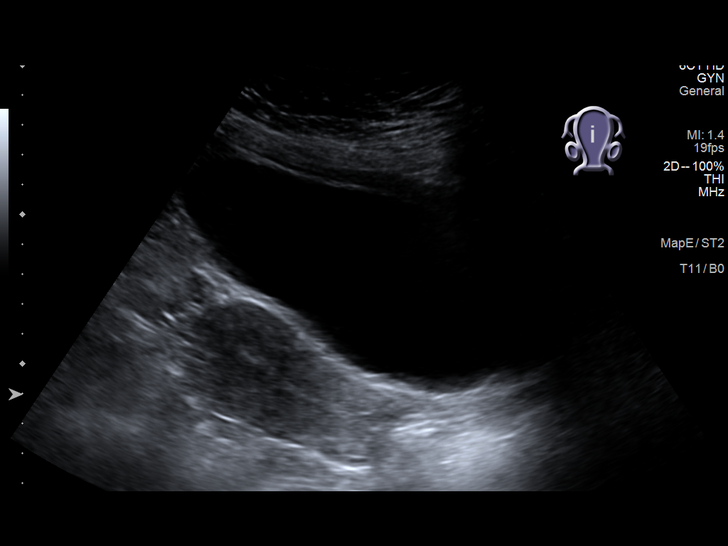
[im 10/118]
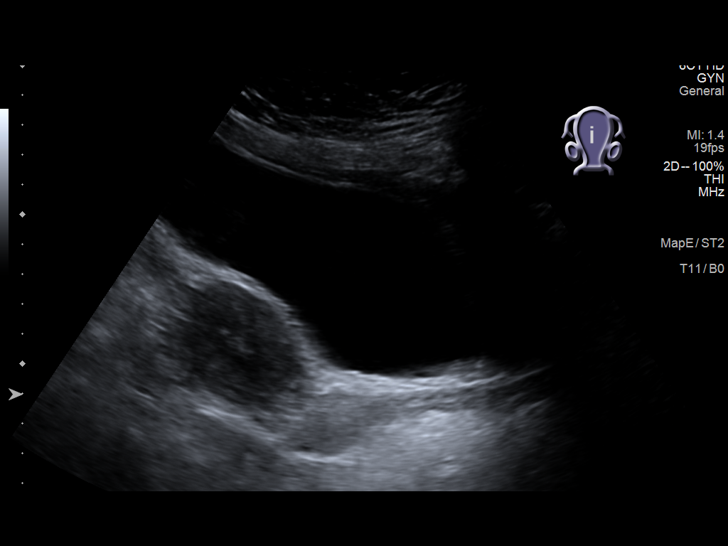
[im 20/118]
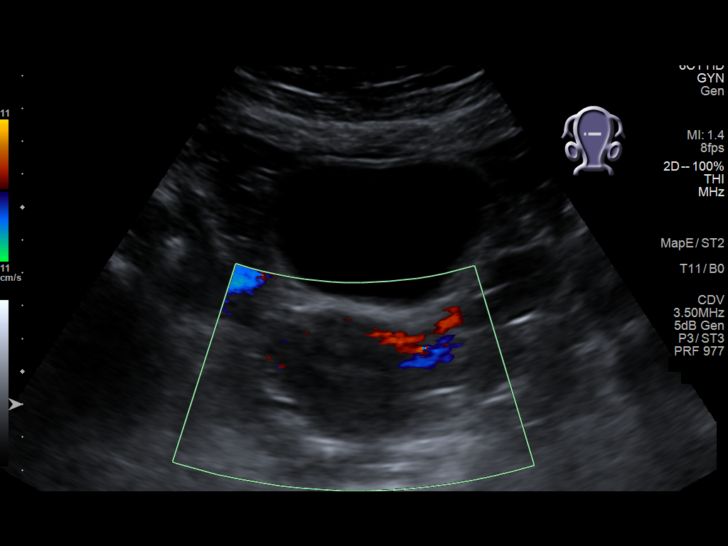
[im 30/118]
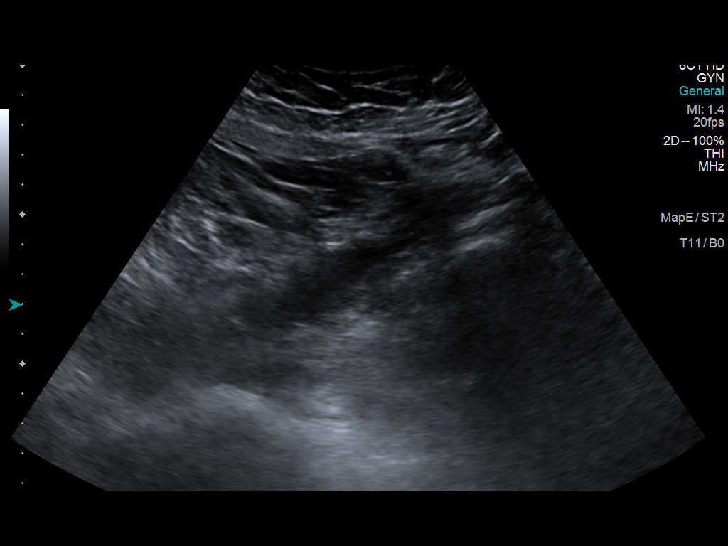
[im 40/118]
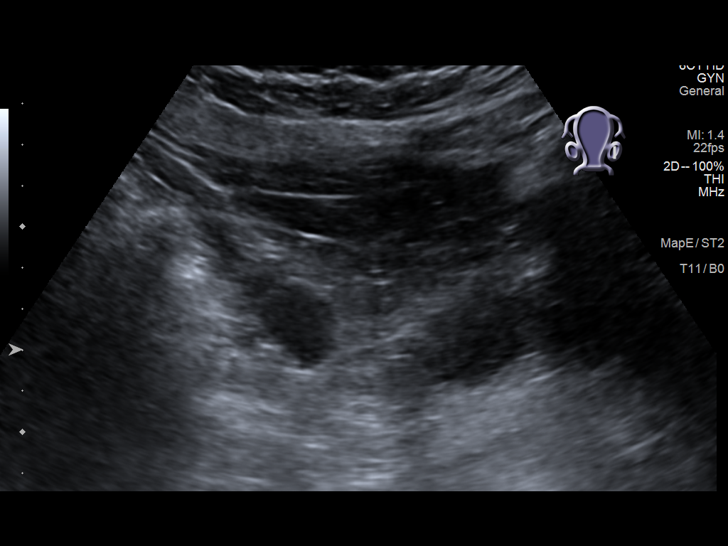
[im 49/118]
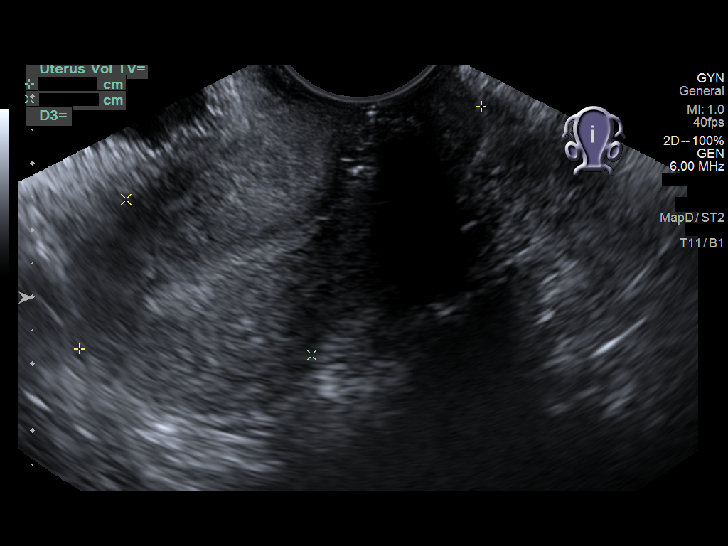
[im 59/118]
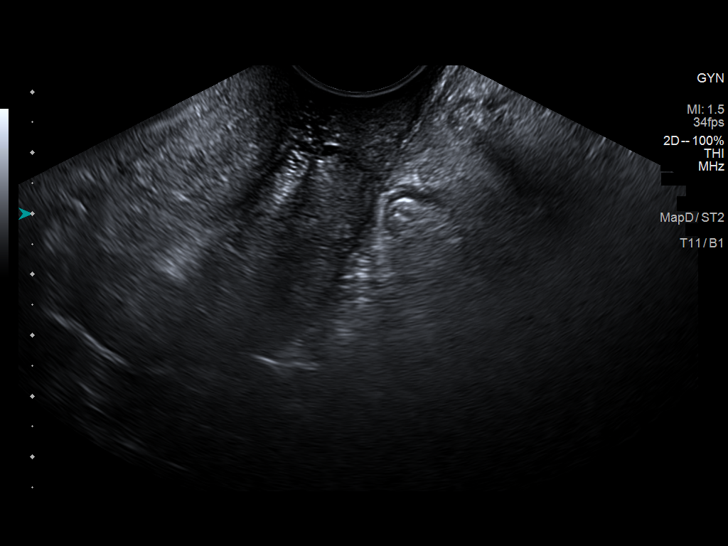
[im 69/118]
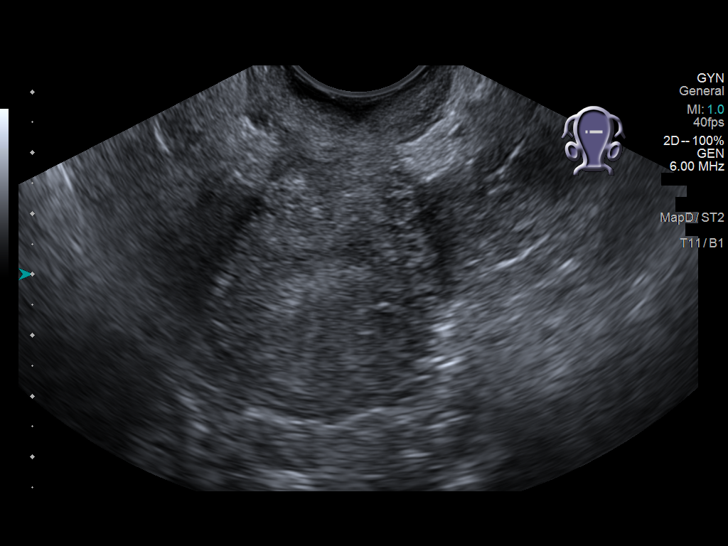
[im 79/118]
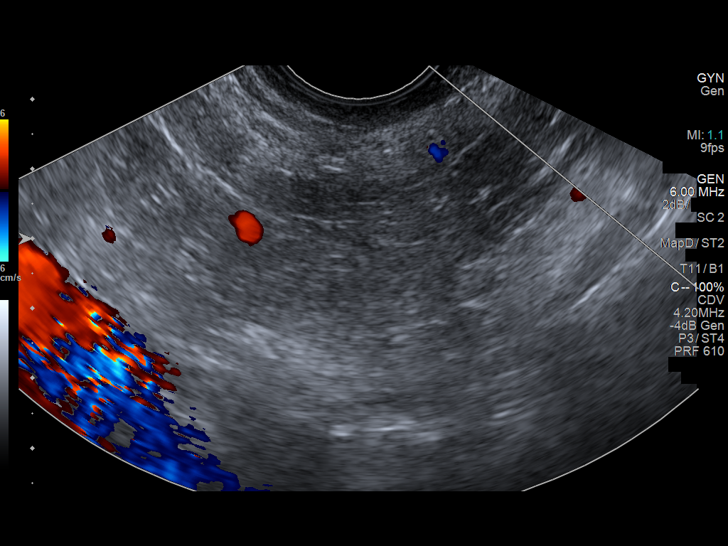
[im 88/118]
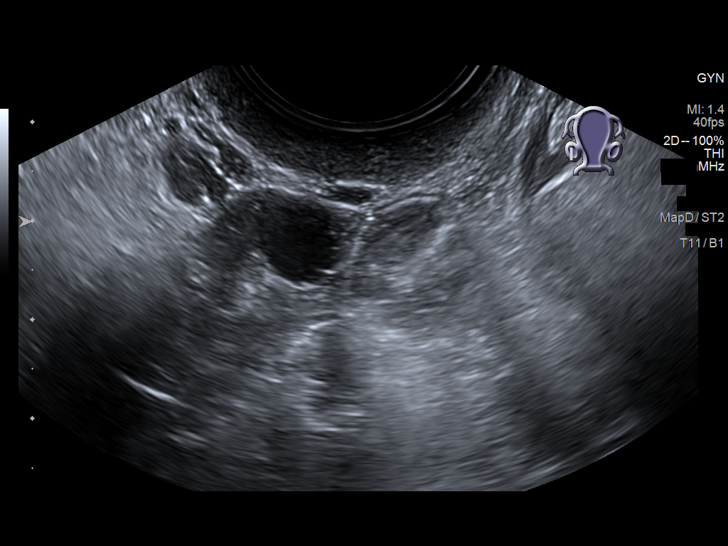
[im 98/118]
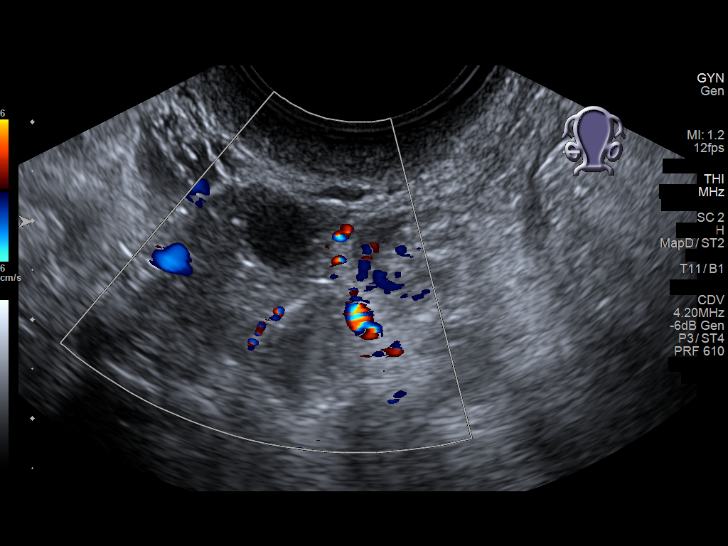
[im 108/118]
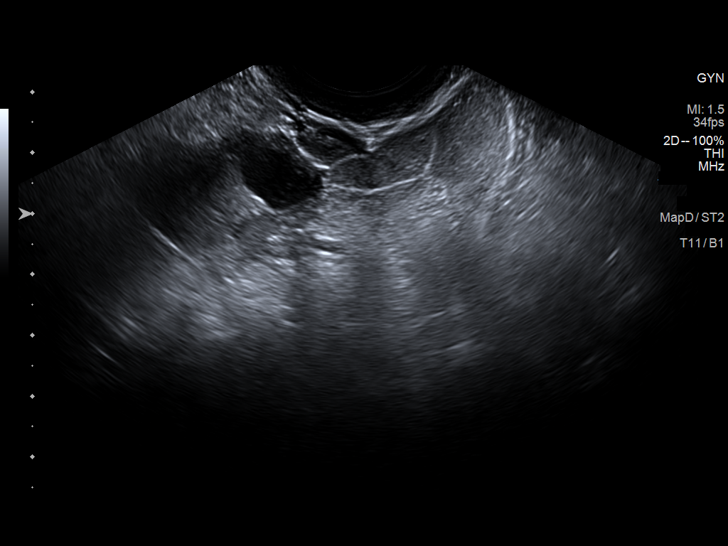
[im 118/118]
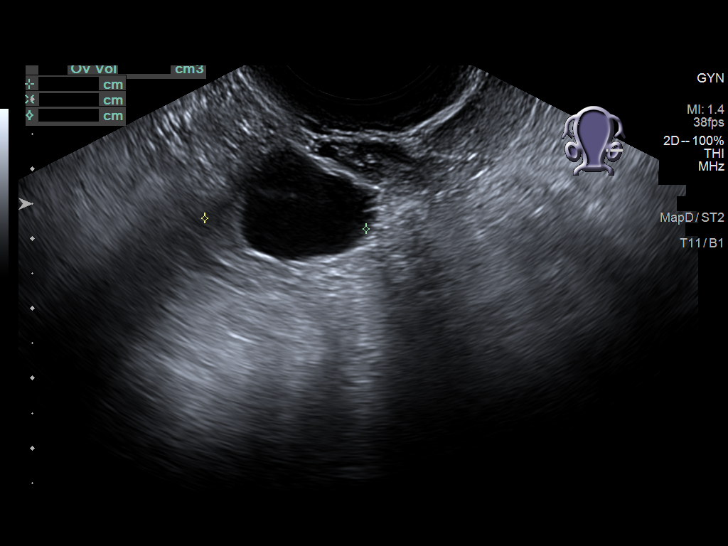

[13 of 25 positions shown; findings below may reference images not displayed]

FINDINGS: Uterus

Measurements: 7.0 x 3.6 x 3.8 cm = volume: 50.4 mL. No fibroids or
other mass visualized.

Endometrium

Thickness: 6 mm.  No focal abnormality visualized.

Right ovary

Measurements: 2.0 x 1.8 x 1.9 cm = volume: 2.6 mL. Normal
appearance/no adnexal mass. Dominant follicle measuring 1.0 cm

Left ovary

Measurements: 2.8 x 2.3 x 1.7 cm = volume: 5.8 mL. Normal
appearance/no adnexal mass. There is a 2.1 x 1.7 x 1.9 cm cyst left
ovary.

Other findings

No abnormal free fluid.
IMPRESSION: No acute process.

Endometrium measures 6 mm. If bleeding remains unresponsive to
hormonal or medical therapy, sonohysterogram should be considered
for focal lesion work-up. (Ref: Radiological Reasoning: Algorithmic
Workup of Abnormal Vaginal Bleeding with Endovaginal Sonography and
Sonohysterography. AJR [id number]; 191:S68-73)

Multiple ovarian cysts. Most severe: 2.1 cm left ovarian follicle,
normal finding for premenopausal patient (benign simple cyst for
postmenopausal patient). No follow-up imaging is recommended.

Reference: Radiology [DATE]):[id number]

## 2022-12-14 ENCOUNTER — Other Ambulatory Visit: Payer: Self-pay | Admitting: Obstetrics and Gynecology

## 2023-02-14 ENCOUNTER — Ambulatory Visit: Payer: Medicaid Other | Admitting: Obstetrics and Gynecology

## 2023-02-15 ENCOUNTER — Telehealth: Payer: Self-pay

## 2023-02-15 NOTE — Telephone Encounter (Signed)
Patient has a appointment with Dr. Allegra Lai on 02/19/2023. Patient is schedule for a follow up appointment. Patient has never been seen by Dr. Allegra Lai the only thing was done was a screening colonoscopy by Dr. Allegra Lai. Virl Axe you schedule the appointment and Do you know why she is wanting to be seen?  If she is having a problem she would need a referral from her PCP office referring her to our office.   Called and left a message for call back for patient to find out the reason why she is needing to be seen

## 2023-02-15 NOTE — Telephone Encounter (Signed)
okay

## 2023-02-16 NOTE — Telephone Encounter (Signed)
Patient states she has been having incontinence of stool for several months. She states the symptoms are off and on. Before the colonoscopy for several weeks every time she went to the bathroom even if she felt like she just needed to pee she would poop. She states this even happens sometimes during intercourse she will poop a little bit. She states that her gynecologist told her her bladder has dropped. She states they also told her she had budging bowl. She states the symptoms have slowed down since the colonoscopy but still happens sometimes. She states that she has been having constipation and off and on rectal bleeding. She states the rectal bleeding was worse when she was popping a lot. Asked patient to get one of her providers to send [place] a referral. She states she will try to but knows her PCP office closed down the end of September.

## 2023-02-20 ENCOUNTER — Ambulatory Visit: Payer: Medicaid Other | Admitting: Gastroenterology

## 2023-02-20 ENCOUNTER — Encounter: Payer: Self-pay | Admitting: Gastroenterology

## 2023-04-24 ENCOUNTER — Ambulatory Visit: Payer: Medicaid Other

## 2023-05-10 ENCOUNTER — Ambulatory Visit: Payer: Medicaid Other

## 2023-05-10 DIAGNOSIS — N898 Other specified noninflammatory disorders of vagina: Secondary | ICD-10-CM

## 2023-05-10 DIAGNOSIS — R300 Dysuria: Secondary | ICD-10-CM | POA: Diagnosis not present

## 2023-05-10 LAB — POCT URINALYSIS DIPSTICK
Bilirubin, UA: NEGATIVE
Blood, UA: NEGATIVE
Glucose, UA: NEGATIVE
Ketones, UA: NEGATIVE
Leukocytes, UA: NEGATIVE
Nitrite, UA: NEGATIVE
Protein, UA: NEGATIVE
Urobilinogen, UA: 0.2 U/dL

## 2023-05-10 MED ORDER — FLUCONAZOLE 150 MG PO TABS
150.0000 mg | ORAL_TABLET | Freq: Once | ORAL | 3 refills | Status: AC
Start: 2023-05-10 — End: 2023-05-10

## 2023-05-10 NOTE — Progress Notes (Signed)
 SUBJECTIVE: [name] is a 53 y.o. female who walked in today and added to Nurse Schedule with complains of urinary frequency, urgency,burning and dysuria x 1 month , no  fever, no chills, possible yeast infection/discharge or no bleeding.   OBJECTIVE: Appears well, in no apparent distress. Urine dipstick shows negative for all components .    ASSESSMENT: Dysuria  PLAN: Treatment per orders.  Call or return to clinic prn if these symptoms worsen or fail to improve as anticipated.

## 2023-05-12 LAB — URINE CULTURE

## 2023-06-12 ENCOUNTER — Ambulatory Visit: Payer: Medicaid Other | Admitting: General Practice

## 2023-06-12 NOTE — Progress Notes (Deleted)
 error

## 2023-06-13 ENCOUNTER — Ambulatory Visit: Payer: Medicaid Other | Admitting: Obstetrics & Gynecology

## 2023-06-13 ENCOUNTER — Other Ambulatory Visit (HOSPITAL_COMMUNITY)
Admission: RE | Admit: 2023-06-13 | Discharge: 2023-06-13 | Disposition: A | Discharge status: Home / Self Care | Payer: Medicaid Other | Source: Ambulatory Visit | Attending: Obstetrics & Gynecology | Admitting: Obstetrics & Gynecology

## 2023-06-13 VITALS — BP 125/84 | HR 75

## 2023-06-13 DIAGNOSIS — N951 Menopausal and female climacteric states: Secondary | ICD-10-CM

## 2023-06-13 DIAGNOSIS — Z1231 Encounter for screening mammogram for malignant neoplasm of breast: Secondary | ICD-10-CM | POA: Diagnosis not present

## 2023-06-13 MED ORDER — VITAMIN D3 1.25 MG (50000 UT) PO CAPS
1.0000 | ORAL_CAPSULE | ORAL | 6 refills | Status: DC
Start: 2023-06-13 — End: 2023-07-24

## 2023-06-13 MED ORDER — PREMARIN 0.625 MG/GM VA CREA
TOPICAL_CREAM | VAGINAL | 3 refills | Status: DC
Start: 2023-06-13 — End: 2023-06-14

## 2023-06-13 NOTE — Patient Instructions (Signed)
Two hyaluronic acid vaginal moisturizers  HYALO GYN is a personal lubricant intended to moisturize and lubricate to enhance the ease and comfort of intimate sexual activity and supplement the body's natural lubrication. It is available in a colorless, odorless, transparent, aqueous, hydrating gel and a moisturizing suppository, both hormone-free and made without parabens. HYALO GYN acts as a moisturizer and lubricant because of the strong hydrating properties of its proprietary hyaluronic acid derivative component, Hydeal-D.  Revaree provides powerful, hormone-free relief from vaginal dryness, with an easy-to-use vaginal insert that renews your body's moisture for everyday comfort and intimacy.

## 2023-06-13 NOTE — Progress Notes (Signed)
GYNECOLOGY OFFICE VISIT NOTE  History:   [name] is a 53 y.o. [phone] here today for evaluation of vaginal burning and dryness.  Has noticed this off and on for a few months, especially during intercourse.  Has not used any intervention yet. Also worried she may have inflammation for yeast or BV, wants evaluation for this.  She denies any abnormal vaginal discharge, bleeding, pelvic pain or other concerns.    Past Medical History:  Diagnosis Date   Allergy    Asthma    Bladder disorder    Bladder infection    Constipation    Eczema    Endometriosis    Ovarian cyst    UTI (lower urinary tract infection)     Past Surgical History:  Procedure Laterality Date   BLADDER SURGERY     COLONOSCOPY WITH PROPOFOL N/A 11/28/2022   Procedure: COLONOSCOPY WITH PROPOFOL;  Surgeon: Toney Reil, MD;  Location: [facility] ENDOSCOPY;  Service: Gastroenterology;  Laterality: N/A;   LAPAROSCOPY     TUBAL LIGATION      The following portions of the patient's history were reviewed and updated as appropriate: allergies, current medications, past family history, past medical history, past social history, past surgical history and problem list.   Health Maintenance:  Normal pap and negative HRHPV on 08/23/2022.  No recent mammogram seen.  Review of Systems:  Pertinent items noted in HPI and remainder of comprehensive ROS otherwise negative.  Physical Exam:  BP 125/84   Pulse 75  CONSTITUTIONAL: Well-developed, well-nourished female in no acute distress.  HEENT:  Normocephalic, atraumatic. External right and left ear normal. No scleral icterus.  NECK: Normal range of motion, supple, no masses noted on observation SKIN: No rash noted. Not diaphoretic. No erythema. No pallor. MUSCULOSKELETAL: Normal range of motion. No edema noted. NEUROLOGIC: Alert and oriented to person, place, and time. Normal muscle tone coordination. No cranial nerve deficit noted. PSYCHIATRIC: Normal mood and  affect. Normal behavior. Normal judgment and thought content. CARDIOVASCULAR: Normal heart rate noted RESPIRATORY: Effort and breath sounds normal, no problems with respiration noted ABDOMEN: No masses noted. No other overt distention noted.   PELVIC: Atrophic appearing external genitalia and urethral meatus, atrophic introitus and distal vaginal mucosa.  No erythema, no lesions, normal architecture.  No abnormal discharge noted, testing sample obtained.  Performed in the presence of a chaperone      Assessment and Plan:    1. Breast cancer screening by mammogram Mammogram to be scheduled for patient. - MM 3D SCREENING MAMMOGRAM BILATERAL BREAST; Future  2. Vaginal dryness, menopausal (Primary) Patient has menopausal vulvovaginal atrophy. Discussed management options, recommended topical estrogen therapy. Can also use hyaluronic vaginal moisturizers in combination with this. Premarin vaginal cream prescribed, will reevaluate response in about six weeks.  Proper vulvar hygiene emphasized: discussed avoidance of perfumed soaps, detergents, lotions and any type of douches; in addition to wearing cotton underwear and no underwear at night.  Also recommended cleaning front to back, voiding and cleaning up after intercourse. Will follow up vaginitis testing results and manage accordingly. - conjugated estrogens (PREMARIN) vaginal cream; Place 2 g vaginally at bedtime for 2 weeks, then use three times a week  Dispense: 60 g; Refill: 3 - Cervicovaginal ancillary only done, will follow up results and manage accordingly.  Return in about 6 weeks (around 07/25/2023) for Follow up vaginal dryness.     I spent 30 minutes dedicated to the care of this patient including pre-visit review of records,  face to face time with the patient discussing her conditions and treatments, post visit ordering of medications and appropriate tests or procedures, coordinating care and documenting this visit encounter.    Jaynie Collins, MD, FACOG Obstetrician & Gynecologist, [facility] for [employer], [facility]

## 2023-06-14 MED ORDER — VAGIFEM 10 MCG VA TABS
ORAL_TABLET | VAGINAL | 12 refills | Status: DC
Start: 2023-06-14 — End: 2023-11-02

## 2023-06-14 NOTE — Progress Notes (Signed)
Patient wanted vaginal tablets instead, this was prescribed for her and Premarin cream was discontinued. - Estradiol (VAGIFEM) 10 MCG TABS vaginal tablet; Place one tablet vaginally every night for two weeks, then use three times a week  Dispense: 30 tablet; Refill: 12 Will monitor response.   Jaynie Collins, MD, FACOG Obstetrician & Gynecologist, [facility] [employer], [facility]

## 2023-06-14 NOTE — Addendum Note (Signed)
Addended by: Jaynie Collins A on: 06/14/2023 10:59 AM   Modules accepted: Orders

## 2023-06-15 LAB — CERVICOVAGINAL ANCILLARY ONLY
Bacterial Vaginitis (gardnerella): NEGATIVE
Candida Glabrata: NEGATIVE
Candida Vaginitis: NEGATIVE
Comment: NEGATIVE
Comment: NEGATIVE
Comment: NEGATIVE
Comment: NEGATIVE
Trichomonas: NEGATIVE

## 2023-06-16 ENCOUNTER — Encounter: Payer: Self-pay | Admitting: Obstetrics & Gynecology

## 2023-06-19 ENCOUNTER — Other Ambulatory Visit: Payer: Self-pay | Admitting: Obstetrics & Gynecology

## 2023-06-19 DIAGNOSIS — N951 Menopausal and female climacteric states: Secondary | ICD-10-CM

## 2023-06-20 ENCOUNTER — Other Ambulatory Visit (HOSPITAL_COMMUNITY)
Admission: RE | Admit: 2023-06-20 | Discharge: 2023-06-20 | Disposition: A | Discharge status: Home / Self Care | Payer: Medicaid Other | Source: Ambulatory Visit | Attending: Obstetrics and Gynecology | Admitting: Obstetrics and Gynecology

## 2023-06-20 ENCOUNTER — Ambulatory Visit: Payer: Medicaid Other

## 2023-06-20 DIAGNOSIS — R399 Unspecified symptoms and signs involving the genitourinary system: Secondary | ICD-10-CM | POA: Insufficient documentation

## 2023-06-20 DIAGNOSIS — N9489 Other specified conditions associated with female genital organs and menstrual cycle: Secondary | ICD-10-CM

## 2023-06-20 LAB — CERVICOVAGINAL ANCILLARY ONLY
Comment: NEGATIVE
Comment: NEGATIVE
Comment: NEGATIVE

## 2023-06-20 NOTE — Progress Notes (Signed)
SUBJECTIVE:  53 y.o. female complains of  dysuria, urinary urgency, and urinary frequency Denies abnormal vaginal bleeding or significant pelvic pain or fever.Denies history of known exposure to STD.  No LMP recorded. (Menstrual status: Bilateral Tubal Ligation).  OBJECTIVE:  She appears alert, well appearing, in no apparent distress Urine dipstick: not done.  ASSESSMENT:  Vaginal Burning  Dysuria    PLAN:  BVAG, CVAG probe and urine culture sent to lab. Treatment: To be determined once lab results are received ROV prn if symptoms persist or worsen.

## 2023-06-21 ENCOUNTER — Encounter: Payer: Self-pay | Admitting: Obstetrics and Gynecology

## 2023-06-21 LAB — URINE CULTURE

## 2023-06-22 ENCOUNTER — Encounter: Payer: Self-pay | Admitting: Obstetrics and Gynecology

## 2023-06-28 ENCOUNTER — Emergency Department: Payer: Medicaid Other

## 2023-06-28 ENCOUNTER — Other Ambulatory Visit: Payer: Self-pay

## 2023-06-28 ENCOUNTER — Emergency Department
Admission: EM | Admit: 2023-06-28 | Discharge: 2023-06-28 | Disposition: A | Discharge status: Home / Self Care | Payer: Medicaid Other | Attending: Emergency Medicine | Admitting: Emergency Medicine

## 2023-06-28 DIAGNOSIS — M67472 Ganglion, left ankle and foot: Secondary | ICD-10-CM | POA: Diagnosis not present

## 2023-06-28 DIAGNOSIS — R2242 Localized swelling, mass and lump, left lower limb: Secondary | ICD-10-CM | POA: Diagnosis present

## 2023-06-28 DIAGNOSIS — J45909 Unspecified asthma, uncomplicated: Secondary | ICD-10-CM | POA: Diagnosis not present

## 2023-06-28 DIAGNOSIS — R200 Anesthesia of skin: Secondary | ICD-10-CM | POA: Diagnosis not present

## 2023-06-28 DIAGNOSIS — R202 Paresthesia of skin: Secondary | ICD-10-CM

## 2023-06-28 DIAGNOSIS — M6740 Ganglion, unspecified site: Secondary | ICD-10-CM

## 2023-06-28 LAB — COMPREHENSIVE METABOLIC PANEL
ALT: 23 U/L (ref 0–44)
AST: 18 U/L (ref 15–41)
Albumin: 3.7 g/dL (ref 3.5–5.0)
Alkaline Phosphatase: 55 U/L (ref 38–126)
Anion gap: 10 (ref 5–15)
BUN: 19 mg/dL (ref 6–20)
CO2: 22 mmol/L (ref 22–32)
Calcium: 8.7 mg/dL — ABNORMAL LOW (ref 8.9–10.3)
Chloride: 106 mmol/L (ref 98–111)
Creatinine, Ser: 0.71 mg/dL (ref 0.44–1.00)
GFR, Estimated: >60 mL/min (ref >60)
Glucose, Bld: 103 mg/dL — ABNORMAL HIGH (ref 70–99)
Potassium: 3.8 mmol/L (ref 3.5–5.1)
Sodium: 138 mmol/L (ref 135–145)
Total Bilirubin: 0.6 mg/dL (ref 0.0–1.2)
Total Protein: 7 g/dL (ref 6.5–8.1)

## 2023-06-28 LAB — CBC
HCT: 40.9 % (ref 36.0–46.0)
Hemoglobin: 13.5 g/dL (ref 12.0–15.0)
MCH: 32.4 pg (ref 26.0–34.0)
MCHC: 33 g/dL (ref 30.0–36.0)
MCV: 98.1 fL (ref 80.0–100.0)
Platelets: 218 10*3/uL (ref 150–400)
RBC: 4.17 MIL/uL (ref 3.87–5.11)
RDW: 13 % (ref 11.5–15.5)
WBC: 7.7 10*3/uL (ref 4.0–10.5)
nRBC: 0 % (ref 0.0–0.2)

## 2023-06-28 LAB — APTT: aPTT: 26 s (ref 24–36)

## 2023-06-28 LAB — DIFFERENTIAL
Abs Immature Granulocytes: 0.02 10*3/uL (ref 0.00–0.07)
Basophils Absolute: 0 10*3/uL (ref 0.0–0.1)
Basophils Relative: 0 %
Eosinophils Absolute: 0.1 10*3/uL (ref 0.0–0.5)
Eosinophils Relative: 2 %
Immature Granulocytes: 0 %
Lymphocytes Relative: 27 %
Lymphs Abs: 2.1 10*3/uL (ref 0.7–4.0)
Monocytes Absolute: 0.4 10*3/uL (ref 0.1–1.0)
Monocytes Relative: 5 %
Neutro Abs: 5 10*3/uL (ref 1.7–7.7)
Neutrophils Relative %: 66 %

## 2023-06-28 LAB — TROPONIN I (HIGH SENSITIVITY): Troponin I (High Sensitivity): <2 ng/L (ref <18)

## 2023-06-28 LAB — PROTIME-INR
INR: 1 (ref 0.8–1.2)
Prothrombin Time: 12.9 s (ref 11.4–15.2)

## 2023-06-28 LAB — ETHANOL: Alcohol, Ethyl (B): <10 mg/dL (ref <10)

## 2023-06-28 MED ORDER — SODIUM CHLORIDE 0.9% FLUSH: 3.0000 mL | Freq: Once | INTRAVENOUS | Status: DC

## 2023-06-28 NOTE — Discharge Instructions (Addendum)
 Please follow-up with your doctor for recheck/reevaluation.  Return to the emergency department for any symptom personally concerning to yourself.

## 2023-06-28 NOTE — ED Notes (Signed)
 Dr. Lenard Lance MD at bedside for reevaluation  ?

## 2023-06-28 NOTE — ED Notes (Signed)
 Pt via POV from home. Pt reports "knot on the the top of my L foot" reports that she noticed it 2 days ago and she has a burning sensation that travels up her leg. Denies any injury. Swelling noted to the L foot. No redness. Pt reports that she has woken up the past 2 days with L arm numbness and "bugs in my hair." Reports itching to the top of her head. Pt is A&OX4 and NAD

## 2023-06-28 NOTE — ED Provider Notes (Signed)
 [facility] Provider Note    Event Date/Time   First MD Initiated Contact with Patient 06/28/23 1212     (approximate)  History   Chief Complaint: Numbness  HPI  [name] is a 53 y.o. female with a past medical history of endometriosis, ovarian cyst, asthma, presents to the emergency department with 2 complaints.  Patient's main complaint she says that she has noticed a bump to the top of her left foot.  States she has noticed this over the last few days and thought it got bigger.  Patient denies any fever denies any significant pain to this area.  As a secondary complaint patient also noted a numbness sensation to her body but more so on the left side.  Patient states she gets this symptom very frequently for many years now.  Denies any weakness.  No slurred speech no confusion.  Physical Exam   Triage Vital Signs: ED Triage Vitals [06/28/23 1034]  Encounter Vitals Group     BP (!) 142/84     Systolic BP Percentile      Diastolic BP Percentile      Pulse Rate 76     Resp 17     Temp 97.9 F (36.6 C)     Temp Source Oral     SpO2 100 %     Weight 200 lb (90.7 kg)     Height 5\' 3"  (1.6 m)     Head Circumference      Peak Flow      Pain Score 8     Pain Loc      Pain Education      Exclude from Growth Chart     Most recent vital signs: Vitals:   06/28/23 1034  BP: (!) 142/84  Pulse: 76  Resp: 17  Temp: 97.9 F (36.6 C)  SpO2: 100%    General: Awake, no distress.  CV:  Good peripheral perfusion.  Regular rate and rhythm  Resp:  Normal effort.  Equal breath sounds bilaterally.  Abd:  No distention.  Soft, nontender.  No rebound or guarding. Other:  Minimal pedal edema bilaterally left slightly greater than right.  Patient does have a slightly raised area to the dorsal aspect of the left foot but largely nontender no erythema possibly ganglion cyst.  ED Results / Procedures / Treatments   RADIOLOGY  I have reviewed and  interpreted CT head images.  No obvious bleed seen on my evaluation. Radiology is read the CT scan is negative.   MEDICATIONS ORDERED IN ED: Medications  sodium chloride flush (NS) 0.9 % injection 3 mL (has no administration in time range)     IMPRESSION / MDM / ASSESSMENT AND PLAN / ED COURSE  I reviewed the triage vital signs and the nursing notes.  Patient's presentation is most consistent with acute presentation with potential threat to life or bodily function.  Patient presents to the emergency department with a bump to the dorsal aspect of her left foot.  Nontender slightly raised area does not appear consistent clinically with an abscess, more consistent with a cyst possible ganglion cyst.  Patient also is complaining of increased swelling in the left leg compared to the right leg.  Denies any history of DVT or PE previously.  Will obtain an ultrasound of the left lower extremity as a precaution.  Given the patient's complaint of of numbness sensation throughout her body but worse on the left side a CT scan of  the head was performed showing normal results, lab work shows a reassuring CBC with a normal white blood cell count, reassuring chemistry.  When asked about the numbness patient states she gets this symptom all the time for many years now and it is nothing new for her.  Given the patient's chronicity of her symptoms with a reassuring CT I believe patient could follow-up outpatient with neurology for further evaluation.  Lab work reassuring, awaiting ultrasound results of the left lower extremity.  Patient's workup is reassuring including lab work including a negative troponin.  Patient's ultrasound has now resulted negative for DVT.  Given the patient's reassuring workup I believe the patient safe for discharge home with outpatient PCP follow-up.  Patient agreeable to plan.  FINAL CLINICAL IMPRESSION(S) / ED DIAGNOSES   Paresthesias Ganglion cyst  Note:  This document was  prepared using Dragon voice recognition software and may include unintentional dictation errors.   Minna Antis, MD 06/28/23 [phone]

## 2023-06-28 NOTE — ED Triage Notes (Signed)
 Pt sts that she has been having left side body numbness for the last two days. Pt sts that she does have a swollen knot on the top of her left foot. Pt is ambulatory with no assistance needed.

## 2023-06-28 NOTE — ED Notes (Signed)
 Pt transported to [place] at this time.

## 2023-06-28 NOTE — ED Notes (Signed)
 Dr. Lenard Lance at bedside for evaluation.

## 2023-07-04 ENCOUNTER — Telehealth: Payer: Self-pay | Admitting: Podiatry

## 2023-07-04 ENCOUNTER — Ambulatory Visit (INDEPENDENT_AMBULATORY_CARE_PROVIDER_SITE_OTHER): Admitting: Podiatry

## 2023-07-04 DIAGNOSIS — M67472 Ganglion, left ankle and foot: Secondary | ICD-10-CM

## 2023-07-04 NOTE — Telephone Encounter (Signed)
 Patient wants to know what the procedure is like to drain the cyst.  She forgot to ask Dr. Allena Katz.  Please call.

## 2023-07-04 NOTE — Progress Notes (Signed)
 Subjective:  Patient ID: [name], female    DOB: 1970/10/03,  MRN: [id number]  Chief Complaint  Patient presents with   Ganglion Cyst    Top of left foot pt stated she went to the ER and has a cyst on her foot that it causing her some discomfort     53 y.o. female presents with the above complaint.  Patient presents with left dorsal ganglion cyst that is causing her some discomfort.  She went recently went to the emergency room to get further evaluated she wanted to discuss treatment options for this pain scale is 5 out of 10 dull aching nature hurts with ambulation worse with pressure it was much bigger like a golf ball size however has decreased in size since that   Review of Systems: Negative except as noted in the HPI. Denies N/V/F/Ch.  Past Medical History:  Diagnosis Date   Allergy    Asthma    Bladder disorder    Bladder infection    Constipation    Eczema    Endometriosis    Ovarian cyst    UTI (lower urinary tract infection)     Current Outpatient Medications:    ondansetron (ZOFRAN) 4 MG tablet, Take 1 tablet (4 mg total) by mouth every 8 (eight) hours as needed for nausea or vomiting. (Patient not taking: Reported on 06/13/2023), Disp: 20 tablet, Rfl: 0   albuterol (VENTOLIN HFA) 108 (90 Base) MCG/ACT inhaler, Inhale 2 puffs into the lungs every 6 (six) hours as needed for wheezing or shortness of breath. (Patient not taking: Reported on 06/13/2023), Disp: 18 g, Rfl: 2   Cholecalciferol (VITAMIN D3) 1.25 MG (50000 UT) CAPS, Take 1 capsule (1.25 mg total) by mouth every 30 (thirty) days., Disp: 1 capsule, Rfl: 6   Estradiol (VAGIFEM) 10 MCG TABS vaginal tablet, Place one tablet vaginally every night for two weeks, then use three times a week, Disp: 30 tablet, Rfl: 12   furosemide (LASIX) 20 MG tablet, Take 1 tablet (20 mg total) by mouth daily for 7 days., Disp: 7 tablet, Rfl: 0  Social History   Tobacco Use  Smoking Status Every Day   Current packs/day: 0.50    Average packs/day: 0.5 packs/day for 20.0 years (10.0 ttl pk-yrs)   Types: Cigarettes  Smokeless Tobacco Never    Allergies  Allergen Reactions   Codeine Anaphylaxis    Shortness of breath   Elemental Sulfur Rash   Penicillins Itching and Other (See Comments)    Causes yeast infections palpatations    Sulfa Antibiotics Rash   Sulfamethoxazole Itching and Other (See Comments)    Causes yeast infections   Objective:  There were no vitals filed for this visit. There is no height or weight on file to calculate BMI. Constitutional Well developed. Well nourished.  Vascular Dorsalis pedis pulses palpable bilaterally. Posterior tibial pulses palpable bilaterally. Capillary refill normal to all digits.  No cyanosis or clubbing noted. Pedal hair growth normal.  Neurologic Normal speech. Oriented to person, place, and time. Epicritic sensation to light touch grossly present bilaterally.  Dermatologic Left ankle ganglion cyst pain with dorsiflexion of the ankle joint no pain with plantarflexion of the ankle joint.  This is a soft mobile single lobulated nodule.  Positive transilluminates does not appear to be indurated  Orthopedic: Normal joint ROM without pain or crepitus bilaterally. No visible deformities. No bony tenderness.   Radiographs: None Assessment:   1. Ganglion cyst of left foot    Plan:  Patient was evaluated and treated and all questions answered.  Left ankle ganglion cyst -All questions and concerns were discussed with the patient extensive detail given the presence of ganglion cyst I believe patient will benefit from surgical excision of the cyst I discussed this with the patient she states understanding and will get back to me when she is ready -I discussed shoe gear modification  No follow-ups on file.

## 2023-07-06 ENCOUNTER — Ambulatory Visit

## 2023-07-06 ENCOUNTER — Ambulatory Visit: Admitting: Podiatry

## 2023-07-06 DIAGNOSIS — M7989 Other specified soft tissue disorders: Secondary | ICD-10-CM | POA: Diagnosis not present

## 2023-07-06 DIAGNOSIS — D492 Neoplasm of unspecified behavior of bone, soft tissue, and skin: Secondary | ICD-10-CM

## 2023-07-06 DIAGNOSIS — M67472 Ganglion, left ankle and foot: Secondary | ICD-10-CM

## 2023-07-06 NOTE — Progress Notes (Signed)
 Subjective:  Patient ID: [name], female    DOB: October 07, 1970,  MRN: [id number]  Chief Complaint  Patient presents with   Ganglion Cyst    53 y.o. female presents with the above complaint.  Patient presents with left dorsal ganglion cyst that is causing her some discomfort.  She went recently went to the emergency room to get further evaluated she wanted to discuss treatment options for this pain scale is 5 out of 10 dull aching nature hurts with ambulation worse with pressure it was much bigger like a golf ball size however has decreased in size since that   Review of Systems: Negative except as noted in the HPI. Denies N/V/F/Ch.  Past Medical History:  Diagnosis Date   Allergy    Asthma    Bladder disorder    Bladder infection    Constipation    Eczema    Endometriosis    Ovarian cyst    UTI (lower urinary tract infection)     Current Outpatient Medications:    ondansetron (ZOFRAN) 4 MG tablet, Take 1 tablet (4 mg total) by mouth every 8 (eight) hours as needed for nausea or vomiting. (Patient not taking: Reported on 06/13/2023), Disp: 20 tablet, Rfl: 0   albuterol (VENTOLIN HFA) 108 (90 Base) MCG/ACT inhaler, Inhale 2 puffs into the lungs every 6 (six) hours as needed for wheezing or shortness of breath. (Patient not taking: Reported on 06/13/2023), Disp: 18 g, Rfl: 2   Cholecalciferol (VITAMIN D3) 1.25 MG (50000 UT) CAPS, Take 1 capsule (1.25 mg total) by mouth every 30 (thirty) days., Disp: 1 capsule, Rfl: 6   Estradiol (VAGIFEM) 10 MCG TABS vaginal tablet, Place one tablet vaginally every night for two weeks, then use three times a week, Disp: 30 tablet, Rfl: 12   furosemide (LASIX) 20 MG tablet, Take 1 tablet (20 mg total) by mouth daily for 7 days., Disp: 7 tablet, Rfl: 0  Social History   Tobacco Use  Smoking Status Every Day   Current packs/day: 0.50   Average packs/day: 0.5 packs/day for 20.0 years (10.0 ttl pk-yrs)   Types: Cigarettes  Smokeless Tobacco  Never    Allergies  Allergen Reactions   Codeine Anaphylaxis    Shortness of breath   Elemental Sulfur Rash   Penicillins Itching and Other (See Comments)    Causes yeast infections palpatations    Sulfa Antibiotics Rash   Sulfamethoxazole Itching and Other (See Comments)    Causes yeast infections   Objective:  There were no vitals filed for this visit. There is no height or weight on file to calculate BMI. Constitutional Well developed. Well nourished.  Vascular Dorsalis pedis pulses palpable bilaterally. Posterior tibial pulses palpable bilaterally. Capillary refill normal to all digits.  No cyanosis or clubbing noted. Pedal hair growth normal.  Neurologic Normal speech. Oriented to person, place, and time. Epicritic sensation to light touch grossly present bilaterally.  Dermatologic Left ankle ganglion cyst pain with dorsiflexion of the ankle joint no pain with plantarflexion of the ankle joint.  This is a soft mobile single lobulated nodule.  Positive transilluminates does not appear to be indurated  Orthopedic: Normal joint ROM without pain or crepitus bilaterally. No visible deformities. No bony tenderness.   Radiographs: None Assessment:   1. Ganglion cyst of left foot   2. Soft tissue mass    Plan:  Patient was evaluated and treated and all questions answered.  Left ankle ganglion cyst -All questions and concerns were discussed with  the patient extensive detail given the patient has had recurrence of ganglion cyst we will attempt to drain the ganglion cyst -Ganglion cyst aspiration: -One-to-one mixture of half percent lidocaine plain and half percent Marcaine plain was injected into V-block fashion 3 cc.  18-gauge needle with 10 cc syringe was utilized to puncture the skin and into the ganglion cyst.  At this time is important no no drainage was noted likely due to decreased in growth of the cyst. -Given this finding patient will benefit from an MRI.  MRI was  scheduled  No follow-ups on file.

## 2023-07-07 ENCOUNTER — Telehealth: Payer: Self-pay | Admitting: Podiatry

## 2023-07-07 NOTE — Telephone Encounter (Signed)
 Pt having a lot of pain in injection site and foot as bad as it was when she came in. Need to know if this is normal and if there is anything else she can do.

## 2023-07-13 ENCOUNTER — Telehealth: Payer: Self-pay | Admitting: Podiatry

## 2023-07-13 NOTE — Telephone Encounter (Signed)
 Patient is requesting to speak with Dr. Allena Katz or nurse regarding results from X-rays pertaining to  cyst and arthritis on left foot. Patient contact telephone number, [phone]

## 2023-07-18 ENCOUNTER — Ambulatory Visit (INDEPENDENT_AMBULATORY_CARE_PROVIDER_SITE_OTHER): Payer: Medicaid Other | Admitting: General Practice

## 2023-07-18 ENCOUNTER — Encounter: Payer: Self-pay | Admitting: General Practice

## 2023-07-18 VITALS — BP 124/86 | HR 79 | Temp 98.0°F | Ht 63.0 in | Wt 210.0 lb

## 2023-07-18 DIAGNOSIS — E559 Vitamin D deficiency, unspecified: Secondary | ICD-10-CM | POA: Insufficient documentation

## 2023-07-18 DIAGNOSIS — Z1159 Encounter for screening for other viral diseases: Secondary | ICD-10-CM | POA: Diagnosis not present

## 2023-07-18 DIAGNOSIS — Z6837 Body mass index (BMI) 37.0-37.9, adult: Secondary | ICD-10-CM | POA: Diagnosis not present

## 2023-07-18 DIAGNOSIS — Z0000 Encounter for general adult medical examination without abnormal findings: Secondary | ICD-10-CM | POA: Insufficient documentation

## 2023-07-18 DIAGNOSIS — Z7689 Persons encountering health services in other specified circumstances: Secondary | ICD-10-CM | POA: Insufficient documentation

## 2023-07-18 DIAGNOSIS — E6609 Other obesity due to excess calories: Secondary | ICD-10-CM | POA: Diagnosis not present

## 2023-07-18 DIAGNOSIS — Z114 Encounter for screening for human immunodeficiency virus [HIV]: Secondary | ICD-10-CM

## 2023-07-18 DIAGNOSIS — E66812 Obesity, class 2: Secondary | ICD-10-CM | POA: Diagnosis not present

## 2023-07-18 DIAGNOSIS — Z1231 Encounter for screening mammogram for malignant neoplasm of breast: Secondary | ICD-10-CM

## 2023-07-18 DIAGNOSIS — D649 Anemia, unspecified: Secondary | ICD-10-CM | POA: Insufficient documentation

## 2023-07-18 LAB — LIPID PANEL
Cholesterol: 161 mg/dL (ref 0–200)
HDL: 56.5 mg/dL (ref >39.00)
LDL Cholesterol: 88 mg/dL (ref 0–99)
NonHDL: 104.11
Total CHOL/HDL Ratio: 3
Triglycerides: 81 mg/dL (ref 0.0–149.0)
VLDL: 16.2 mg/dL (ref 0.0–40.0)

## 2023-07-18 LAB — COMPREHENSIVE METABOLIC PANEL
ALT: 18 U/L (ref 0–35)
AST: 14 U/L (ref 0–37)
Albumin: 4.3 g/dL (ref 3.5–5.2)
Alkaline Phosphatase: 62 U/L (ref 39–117)
BUN: 16 mg/dL (ref 6–23)
CO2: 27 meq/L (ref 19–32)
Calcium: 9.5 mg/dL (ref 8.4–10.5)
Chloride: 104 meq/L (ref 96–112)
Creatinine, Ser: 0.74 mg/dL (ref 0.40–1.20)
GFR: 92.31 mL/min (ref >60.00)
Glucose, Bld: 78 mg/dL (ref 70–99)
Potassium: 4.4 meq/L (ref 3.5–5.1)
Sodium: 140 meq/L (ref 135–145)
Total Bilirubin: 0.4 mg/dL (ref 0.2–1.2)
Total Protein: 7.1 g/dL (ref 6.0–8.3)

## 2023-07-18 LAB — HEMOGLOBIN A1C: Hgb A1c MFr Bld: 5.5 % (ref 4.6–6.5)

## 2023-07-18 LAB — TSH: TSH: 1.01 u[IU]/mL (ref 0.35–5.50)

## 2023-07-18 LAB — VITAMIN D 25 HYDROXY (VIT D DEFICIENCY, FRACTURES): VITD: 18.22 ng/mL — ABNORMAL LOW (ref 30.00–100.00)

## 2023-07-18 NOTE — Assessment & Plan Note (Signed)
 EMR reviewed briefly.

## 2023-07-18 NOTE — Assessment & Plan Note (Signed)
 Chronic.  Repeat vitamin d level pending.

## 2023-07-18 NOTE — Assessment & Plan Note (Signed)
 Chronic.   Iron studies pending. Reviewed labs from the ER visit on 06/28/23.

## 2023-07-18 NOTE — Patient Instructions (Addendum)
 Stop by the lab prior to leaving today. I will notify you of your results once received.   You have an order for:  []   2D Mammogram  [x]   3D Mammogram  []   Bone Density     Please call for appointment:  [facility]  [address] [place] [address] [phone] [facility] [address] [place], [place] [address] [phone] [facility] [address] Geanie Logan [place], [place] [address] [phone]   Make sure to wear two-piece clothing.  No lotions, powders, or deodorants the day of the appointment. Make sure to bring picture ID and insurance card.  Bring list of medications you are currently taking including any supplements.   Schedule your  screening mammogram through MyChart!   Log into your MyChart account.  Go to 'Visit' (or 'Appointments' if on mobile App) --> Schedule an Appointment  Under 'Select a Reason for Visit' choose the Mammogram Screening option.  Complete the pre-visit questions and select the time and place that best fits your schedule.

## 2023-07-18 NOTE — Assessment & Plan Note (Signed)
 Immunizations- declines Pap smear UTD. Mammogram due, orders placed. Colonoscopy UTD  Discussed the importance of a healthy diet and regular exercise in order for weight loss, and to reduce the risk of further co-morbidity.  Exam stable. Labs pending.  Follow up in 1 year for repeat physical.

## 2023-07-18 NOTE — Assessment & Plan Note (Signed)
 Discussed the importance of monitoring diet and exercise.   Labs pending to rule out metabolic cause.

## 2023-07-18 NOTE — Progress Notes (Signed)
 New Patient Office Visit  Subjective    Patient ID: [name], female    DOB: [birth year]  Age: 53 y.o. MRN: [id number]  CC:  Chief Complaint  Patient presents with   New Patient (Initial Visit)    HPI [name] is a 53 y.o. female presents to establish care, for complete physical and follow up of foot pain.  Previous PCP/physical/labs: [facility] at [facility] [place]. Physical with GYN. Labs at hospital 2025.  Ganglion Cyst of left foot: She was evaluated on 06/28/23 at [facility] and was diagnosed with ganglion cyst. She was referred to podiatrist who recommended a surgical excision of the cyst on 07/04/23. She went back on 07/06/23 to the podiatrist who attempted to aspirate the cyst however, no drainage was noted, likely due to decreased in growth of the cyst. Then, she was asked to have a MRI however she has not had the MRI yet. She did have an Xray of her left foot on 07/06/23 and was told that she has arthritis and surgery was recommended. Today she reports that she continues to have pain. She is interested in a second opinion and has an appointment schedule next month.    Immunizations: -Tetanus: declined -Influenza: declined -Shingles: declined -Pneumonia: declined  Diet: Fair diet. Trying to eat more vegetables and drinking more water. Exercise: No regular exercise.   Eye exam: several years ago.  Dental exam: last year.     Pap Smear: Completed in 2024 Mammogram: Completed in 2014, order placed.  Colonoscopy: Completed in 2024   Outpatient Encounter Medications as of 07/18/2023  Medication Sig   Cholecalciferol (VITAMIN D3) 1.25 MG (50000 UT) CAPS Take 1 capsule (1.25 mg total) by mouth every 30 (thirty) days.   Estradiol (VAGIFEM) 10 MCG TABS vaginal tablet Place one tablet vaginally every night for two weeks, then use three times a week   [DISCONTINUED] albuterol (VENTOLIN HFA) 108 (90 Base) MCG/ACT inhaler Inhale 2 puffs into the lungs every 6 (six) hours as needed for  wheezing or shortness of breath. (Patient not taking: Reported on 06/13/2023)   [DISCONTINUED] furosemide (LASIX) 20 MG tablet Take 1 tablet (20 mg total) by mouth daily for 7 days.   [DISCONTINUED] ondansetron (ZOFRAN) 4 MG tablet Take 1 tablet (4 mg total) by mouth every 8 (eight) hours as needed for nausea or vomiting. (Patient not taking: Reported on 06/13/2023)   No facility-administered encounter medications on file as of 07/18/2023.    Past Medical History:  Diagnosis Date   Allergy    Arthritis    Asthma    Bladder disorder    Bladder infection    Constipation    Eczema    Endometriosis    GERD (gastroesophageal reflux disease)    Ovarian cyst    Stroke [facility])    Thyroid disease    UTI (lower urinary tract infection)     Past Surgical History:  Procedure Laterality Date   BLADDER SURGERY     COLONOSCOPY WITH PROPOFOL N/A 11/28/2022   Procedure: COLONOSCOPY WITH PROPOFOL;  Surgeon: Toney Reil, MD;  Location: [facility] ENDOSCOPY;  Service: Gastroenterology;  Laterality: N/A;   LAPAROSCOPY     TUBAL LIGATION      Family History  Problem Relation Age of Onset   Stroke Mother    Hypertension Mother    Arthritis Mother    GER disease Mother    Heart attack Mother    Hyperlipidemia Father    Heart attack Father  Heart disease Father    GER disease Father    Hypertension Father    GER disease Sister    GER disease Brother    Cancer Maternal Grandmother    Hyperlipidemia Maternal Grandmother    Heart attack Maternal Grandmother    Hearing loss Maternal Grandfather    Heart attack Maternal Grandfather    Stroke Paternal Grandmother    Hyperlipidemia Paternal Grandmother    Heart attack Paternal Grandmother    Hearing loss Paternal Grandfather     Social History   Socioeconomic History   Marital status: Legally Separated    Spouse name: Not on file   Number of children: 3   Years of education: Not on file   Highest education level: Not on file   Occupational History   Not on file  Tobacco Use   Smoking status: Every Day    Current packs/day: 0.50    Average packs/day: 0.5 packs/day for 20.0 years (10.0 ttl pk-yrs)    Types: Cigarettes   Smokeless tobacco: Never  Vaping Use   Vaping status: Never Used  Substance and Sexual Activity   Alcohol use: Yes    Alcohol/week: 4.0 standard drinks of alcohol    Types: 2 Glasses of wine, 2 Cans of beer per week    Comment: 3 x per week   Drug use: No   Sexual activity: Yes    Birth control/protection: Surgical  Other Topics Concern   Not on file  Social History Narrative   Not on file   Social Drivers of Health   Financial Resource Strain: Not on file  Food Insecurity: Not on file  Transportation Needs: Not on file  Physical Activity: Not on file  Stress: Not on file  Social Connections: Not on file  Intimate Partner Violence: Not on file    Review of Systems  Constitutional:  Negative for chills, fever, malaise/fatigue and weight loss.  HENT:  Negative for congestion, ear discharge, ear pain, hearing loss, nosebleeds, sinus pain, sore throat and tinnitus.   Eyes:  Negative for blurred vision, double vision, pain, discharge and redness.  Respiratory:  Negative for cough, shortness of breath, wheezing and stridor.   Cardiovascular:  Negative for chest pain, palpitations and leg swelling.  Gastrointestinal:  Negative for abdominal pain, constipation, diarrhea, heartburn, nausea and vomiting.  Genitourinary:  Negative for dysuria, frequency and urgency.  Musculoskeletal:  Negative for myalgias.       Left Foot pain.  Skin:  Negative for rash.  Neurological:  Negative for dizziness, tingling, seizures, weakness and headaches.  Psychiatric/Behavioral:  Negative for depression, substance abuse and suicidal ideas. The patient is not nervous/anxious.       Objective    BP 124/86 (BP Location: Left Arm, Patient Position: Sitting, Cuff Size: Normal)   Pulse 79   Temp 98 F  (36.7 C) (Oral)   Ht 5\' 3"  (1.6 m)   Wt 210 lb (95.3 kg)   SpO2 99%   BMI 37.20 kg/m   Physical Exam Vitals and nursing note reviewed.  Constitutional:      Appearance: Normal appearance.  HENT:     Head: Normocephalic and atraumatic.     Right Ear: Tympanic membrane, ear canal and external ear normal.     Left Ear: Tympanic membrane, ear canal and external ear normal.     Nose: Nose normal.     Mouth/Throat:     Mouth: Mucous membranes are moist.     Pharynx: Oropharynx is clear.  Eyes:     Conjunctiva/sclera: Conjunctivae normal.     Pupils: Pupils are equal, round, and reactive to light.  Cardiovascular:     Rate and Rhythm: Normal rate and regular rhythm.     Pulses: Normal pulses.     Heart sounds: Normal heart sounds.  Pulmonary:     Effort: Pulmonary effort is normal.     Breath sounds: Normal breath sounds.  Abdominal:     General: Abdomen is flat. Bowel sounds are normal.     Palpations: Abdomen is soft.  Musculoskeletal:        General: Normal range of motion.     Cervical back: Normal range of motion.  Skin:    General: Skin is warm and dry.     Capillary Refill: Capillary refill takes less than 2 seconds.  Neurological:     General: No focal deficit present.     Mental Status: She is alert and oriented to person, place, and time. Mental status is at baseline.  Psychiatric:        Mood and Affect: Mood normal.        Behavior: Behavior normal.        Thought Content: Thought content normal.        Judgment: Judgment normal.         Assessment & Plan:  Establishing care with new doctor, encounter for Assessment & Plan: EMR reviewed briefly.   Encounter for screening mammogram for malignant neoplasm of breast -     3D Screening Mammogram, Left and Right; Future  Class 2 obesity due to excess calories without serious comorbidity with body mass index (BMI) of 37.0 to 37.9 in adult Assessment & Plan: Discussed the importance of monitoring diet and  exercise.   Labs pending to rule out metabolic cause.  Orders: -     Lipid panel -     Comprehensive metabolic panel -     Hemoglobin A1c -     TSH  Need for hepatitis C screening test -     Hepatitis C antibody  Screening for HIV (human immunodeficiency virus) -     HIV Antibody (routine testing w rflx)  Chronic anemia Assessment & Plan: Chronic.   Iron studies pending. Reviewed labs from the ER visit on 06/28/23.  Orders: -     Iron, TIBC and Ferritin Panel  Encounter for screening and preventative care Assessment & Plan: Immunizations- declines Pap smear UTD. Mammogram due, orders placed. Colonoscopy UTD  Discussed the importance of a healthy diet and regular exercise in order for weight loss, and to reduce the risk of further co-morbidity.  Exam stable. Labs pending.  Follow up in 1 year for repeat physical.  Orders: -     Lipid panel -     Hepatitis C antibody -     HIV Antibody (routine testing w rflx)  Vitamin D deficiency Assessment & Plan: Chronic.   Repeat vitamin d level pending.  Orders: -     VITAMIN D 25 Hydroxy (Vit-D Deficiency, Fractures)     Return in about 2 weeks (around 08/01/2023) for discuss skin and stomach.   Modesto Charon, NP

## 2023-07-19 ENCOUNTER — Encounter: Payer: Self-pay | Admitting: General Practice

## 2023-07-19 DIAGNOSIS — E559 Vitamin D deficiency, unspecified: Secondary | ICD-10-CM

## 2023-07-19 DIAGNOSIS — N951 Menopausal and female climacteric states: Secondary | ICD-10-CM

## 2023-07-19 LAB — IRON,TIBC AND FERRITIN PANEL
%SAT: 27 % (ref 16–45)
Ferritin: 55 ng/mL (ref 16–232)
Iron: 95 ug/dL (ref 45–160)
TIBC: 358 ug/dL (ref 250–450)

## 2023-07-19 LAB — HIV ANTIBODY (ROUTINE TESTING W REFLEX): HIV 1&2 Ab, 4th Generation: NONREACTIVE

## 2023-07-19 LAB — HEPATITIS C ANTIBODY: Hepatitis C Ab: NONREACTIVE

## 2023-07-20 ENCOUNTER — Ambulatory Visit: Payer: Medicaid Other | Admitting: Obstetrics & Gynecology

## 2023-07-24 MED ORDER — VITAMIN D3 1.25 MG (50000 UT) PO CAPS: 1.0000 | ORAL_CAPSULE | ORAL | 6 refills | Status: DC

## 2023-08-01 ENCOUNTER — Ambulatory Visit: Payer: Medicaid Other | Admitting: Obstetrics & Gynecology

## 2023-09-11 ENCOUNTER — Ambulatory Visit: Admitting: Gastroenterology

## 2023-10-12 ENCOUNTER — Ambulatory Visit: Payer: Self-pay

## 2023-10-12 NOTE — Telephone Encounter (Signed)
 This RN made the first attempt to contact the pt for triage. Pt did not answer. RN LVM with a CB number.   Copied from CRM [id number]. Topic: Clinical - Red Word Triage >> Oct 12, 2023  4:29 PM Albertha Alosa wrote: Red Word that prompted transfer to Nurse Triage: Patient stated she has gotten bit by a tick and it is under her skin , wanted to know if there is anything else she can do to loosen it

## 2023-10-12 NOTE — Telephone Encounter (Signed)
 This RN attempted to contact pt, no answer and voice message was left.

## 2023-10-13 NOTE — Telephone Encounter (Signed)
 Unable to reach patient or pts daughter (DPR signed) by phone and left v/m requesting call back at [phone].

## 2023-10-13 NOTE — Telephone Encounter (Addendum)
 Unable to reach patient by phone and left v/m requesting call back at [phone]. Pt needs to schedule appt. Sending note to B Deborra Falter NP,[facility], [facility] triage.

## 2023-10-13 NOTE — Telephone Encounter (Addendum)
 [name]

## 2023-10-13 NOTE — Telephone Encounter (Signed)
 Still unable to reach pt but I did speak with Joyice Nodal pts daughter (DPR signed) Michale Age notified as instructed by Loreli Rogue NP. Joyice Nodal spoke with pt earlier today and pt told her that the area was not itching and was less red. Joyice Nodal is going to try and have pt call [facility] but UC & ED precautions given and Maranda  voiced understanding and  will relay to pt. Sending note to Loreli Rogue NP.

## 2023-10-13 NOTE — Telephone Encounter (Signed)
 Agree. Patient needs to be evaluated in the office.   -Jolanda Nation, DNP, AGNP-C 10/13/2023 9:03 AM

## 2023-11-01 ENCOUNTER — Ambulatory Visit: Payer: Self-pay

## 2023-11-01 NOTE — Telephone Encounter (Addendum)
 FYI Only or Action Required?: Action required by provider: clinical question for provider.  Patient was last seen in primary care on 07/18/2023 by Vincente Shivers, NP. Called Nurse Triage reporting throat pain. Symptoms began several days ago. Interventions attempted: Rest, hydration, or home remedies. Symptoms are: gradually worsening.  Triage Disposition: Go to ED Now (Notify PCP)- pt refused ED. Call transferred to CAL. Pt scheduled for 7/3 at 0845  Patient/caregiver understands and will follow disposition?: NoCopied from CRM [id number]. Topic: Clinical - Red Word Triage >> Nov 01, 2023  4:32 PM Chiquita SQUIBB wrote: Red Word that prompted transfer to Nurse Triage: Patient is calling in stating she is having a hard time swallowing and feels like she is choking but it is not going away. Reason for Disposition  Pain also in shoulder(s) or arm(s) or jaw  (Exception: Pain is clearly made worse by movement.)  Answer Assessment - Initial Assessment Questions 1. LOCATION: Where does it hurt?       Upper middle chest  2. RADIATION: Does the pain go anywhere else? (e.g., into neck, jaw, arms, back)     Left shoulder and some discomfort in throat  3. ONSET: When did the chest pain begin? (Minutes, hours or days)      2 days ago  4. PATTERN: Does the pain come and go, or has it been constant since it started?  Does it get worse with exertion?      Pain in constant, better when relaxed, and worse with exertion  5. DURATION: How long does it last (e.g., seconds, minutes, hours)     -  6. SEVERITY: How bad is the pain?  (e.g., Scale 1-10; mild, moderate, or severe)    - MILD (1-3): doesn't interfere with normal activities     - MODERATE (4-7): interferes with normal activities or awakens from sleep    - SEVERE (8-10): excruciating pain, unable to do any normal activities       Mild to moderate  7. CARDIAC RISK FACTORS: Do you have any history of heart problems or risk factors for heart  disease? (e.g., angina, prior heart attack; diabetes, high blood pressure, high cholesterol, smoker, or strong family history of heart disease)     Smoke, strong family history of heart disease, HLD, and hTN  8. PULMONARY RISK FACTORS: Do you have any history of lung disease?  (e.g., blood clots in lung, asthma, emphysema, birth control pills)     COPD  9. CAUSE: What do you think is causing the chest pain?     Unsure of care  10. OTHER SYMPTOMS: Do you have any other symptoms? (e.g., dizziness, nausea, vomiting, sweating, fever, difficulty breathing, cough)       Trouble swallowing, dizziness over the weekend, and mild difficulty breathing, and throat clearing  11. PREGNANCY: Is there any chance you are pregnant? When was your last menstrual period?       no  Protocols used: Chest Pain-A-AH

## 2023-11-02 ENCOUNTER — Ambulatory Visit (INDEPENDENT_AMBULATORY_CARE_PROVIDER_SITE_OTHER): Admitting: Internal Medicine

## 2023-11-02 ENCOUNTER — Encounter: Payer: Self-pay | Admitting: Internal Medicine

## 2023-11-02 VITALS — BP 120/82 | HR 63 | Temp 98.9°F | Resp 20 | Ht 63.0 in | Wt 210.1 lb

## 2023-11-02 DIAGNOSIS — J029 Acute pharyngitis, unspecified: Secondary | ICD-10-CM | POA: Diagnosis not present

## 2023-11-02 LAB — POCT RAPID STREP A (OFFICE): Rapid Strep A Screen: NEGATIVE

## 2023-11-02 LAB — POC COVID19 BINAXNOW: SARS Coronavirus 2 Ag: NEGATIVE

## 2023-11-02 MED ORDER — OMEPRAZOLE 20 MG PO CPDR: 20.0000 mg | DELAYED_RELEASE_CAPSULE | Freq: Every day | ORAL | 1 refills | Status: DC

## 2023-11-02 MED ORDER — AZITHROMYCIN 250 MG PO TABS: ORAL_TABLET | ORAL | 0 refills | Status: DC

## 2023-11-02 NOTE — Telephone Encounter (Signed)
 Noted I will assess her this morning---could be COVID

## 2023-11-02 NOTE — Assessment & Plan Note (Signed)
 History consistent with infection with current COVID strain--but test negative Some productive cough in smoker Continue ibuprofen  for pain Empiric z-pak Omeprazole for now--in case any acid component

## 2023-11-02 NOTE — Progress Notes (Signed)
 Subjective:    Patient ID: [name], female    DOB: [birth year], 53 y.o.   MRN: [id number]  HPI Here due to sore throat and trouble swallowing  Last week--started with some trouble swallowing Really worsened 3 days ago--and this week Now trouble speaking Pain in back of throat--with swallowing (and head tenses up) Having head pain as well Coughing up thick phlegm---not like typical smoker's stuff (though better now) Pain with swallowing is in upper chest  Some low grade fever at night--gone now Some trouble breathing Not much post nasal drip--gone now  Current Outpatient Medications on File Prior to Visit  Medication Sig Dispense Refill   Cholecalciferol (VITAMIN D3) 1.25 MG (50000 UT) CAPS Take 1 capsule (1.25 mg total) by mouth every 30 (thirty) days. (Patient not taking: Reported on 11/02/2023) 1 capsule 6   Estradiol  (VAGIFEM ) 10 MCG TABS vaginal tablet Place one tablet vaginally every night for two weeks, then use three times a week (Patient not taking: Reported on 11/02/2023) 30 tablet 12   No current facility-administered medications on file prior to visit.    Allergies  Allergen Reactions   Codeine Anaphylaxis    Shortness of breath   Elemental Sulfur Rash   Penicillins Itching and Other (See Comments)    Causes yeast infections palpatations    Sulfa Antibiotics Rash   Sulfamethoxazole Itching and Other (See Comments)    Causes yeast infections    Past Medical History:  Diagnosis Date   Allergy    Arthritis    Asthma    Bladder disorder    Bladder infection    Constipation    Eczema    Endometriosis    GERD (gastroesophageal reflux disease)    Ovarian cyst    Stroke [facility])    Thyroid  disease    UTI (lower urinary tract infection)     Past Surgical History:  Procedure Laterality Date   BLADDER SURGERY     COLONOSCOPY WITH PROPOFOL  N/A 11/28/2022   Procedure: COLONOSCOPY WITH PROPOFOL ;  Surgeon: Unk Corinn Skiff, MD;  Location: [facility]  ENDOSCOPY;  Service: Gastroenterology;  Laterality: N/A;   LAPAROSCOPY     TUBAL LIGATION      Family History  Problem Relation Age of Onset   Stroke Mother    Hypertension Mother    Arthritis Mother    GER disease Mother    Heart attack Mother    Hyperlipidemia Father    Heart attack Father    Heart disease Father    GER disease Father    Hypertension Father    GER disease Sister    GER disease Brother    Cancer Maternal Grandmother    Hyperlipidemia Maternal Grandmother    Heart attack Maternal Grandmother    Hearing loss Maternal Grandfather    Heart attack Maternal Grandfather    Stroke Paternal Grandmother    Hyperlipidemia Paternal Grandmother    Heart attack Paternal Grandmother    Hearing loss Paternal Grandfather     Social History   Socioeconomic History   Marital status: Legally Separated    Spouse name: Not on file   Number of children: 3   Years of education: Not on file   Highest education level: Not on file  Occupational History   Not on file  Tobacco Use   Smoking status: Every Day    Current packs/day: 0.50    Average packs/day: 0.5 packs/day for 20.0 years (10.0 ttl pk-yrs)    Types: Cigarettes  Smokeless tobacco: Never  Vaping Use   Vaping status: Never Used  Substance and Sexual Activity   Alcohol use: Yes    Alcohol/week: 4.0 standard drinks of alcohol    Types: 2 Glasses of wine, 2 Cans of beer per week    Comment: 3 x per week   Drug use: No   Sexual activity: Yes    Birth control/protection: Surgical  Other Topics Concern   Not on file  Social History Narrative   Not on file   Social Drivers of Health   Financial Resource Strain: Not on file  Food Insecurity: Not on file  Transportation Needs: Not on file  Physical Activity: Not on file  Stress: Not on file  Social Connections: Not on file  Intimate Partner Violence: Not on file   Review of Systems Some pain in left shoulder and numbness down left arm No regular  heartburn now--but had in pregnancy Had TIA ~ 3-5 years ago (but sent home from ER without admission)    Objective:   Physical Exam Constitutional:      Appearance: Normal appearance.  HENT:     Head:     Comments: Slight frontal tenderness    Right Ear: Tympanic membrane and ear canal normal.     Left Ear: Tympanic membrane and ear canal normal.     Mouth/Throat:     Pharynx: No oropharyngeal exudate or posterior oropharyngeal erythema.  Cardiovascular:     Rate and Rhythm: Normal rate and regular rhythm.     Heart sounds: No murmur heard.    No gallop.  Pulmonary:     Effort: Pulmonary effort is normal.     Breath sounds: Normal breath sounds. No wheezing or rales.  Musculoskeletal:     Cervical back: Neck supple.  Lymphadenopathy:     Cervical: No cervical adenopathy.  Neurological:     Mental Status: She is alert.            Assessment & Plan:

## 2023-11-23 ENCOUNTER — Ambulatory Visit: Admitting: Obstetrics & Gynecology

## 2023-12-12 ENCOUNTER — Ambulatory Visit: Payer: Self-pay | Admitting: General Practice

## 2023-12-12 NOTE — Telephone Encounter (Signed)
 FYI Only or Action Required?: FYI only for provider.  Patient was last seen in primary care on 11/02/2023 by Jimmy Charlie FERNS, MD.  Called Nurse Triage reporting Back Pain.  Symptoms began several months ago.  Interventions attempted: OTC medications: Advil.  Symptoms are: unchanged.  Triage Disposition: See HCP Within 4 Hours (Or PCP Triage)  Patient/caregiver understands and will follow disposition?: Yes                  Copied from CRM [id number]. Topic: Clinical - Red Word Triage >> Dec 12, 2023  8:41 AM Suzen RAMAN wrote: Red Word that prompted transfer to Nurse Triage: stabbing sharp pain in back, internal hemorrhoid and mention of previous colonoscopy performed due to previous internal bleeding due to hemorrhoid. Reason for Disposition  [1] SEVERE back pain (e.g., excruciating, unable to do any normal activities) AND [2] not improved 2 hours after pain medicine  Answer Assessment - Initial Assessment Questions Pt states she has this intermittent middle back pain and then when she has a bowel movement she feels a little better although it doesn't go completely away. Pt states she did have a bowel movement within last 30 minutes. Pt still rates her pain as 10/10 pain level. This RN advised pt to be seen within 4 hours. Pt states she has another doctor's appt at 10:30 AM. This RN offered pt the afternoon appt times but pt states she needs to find transportation and will call after.     ONSET: When did the pain begin? (e.g., minutes, hours, days)     3 AM this morning started again; going on for months  LOCATION: Where does it hurt? (upper, mid or lower back)     Middle  SEVERITY: How bad is the pain?  (e.g., Scale 1-10; mild, moderate, or severe)     Can hardly move back from middle down; 10/10 pain level constant  RADIATION: Does the pain shoot into your legs or somewhere else?     Down and up; mainly right in middle of back  CAUSE:  What do you  think is causing the back pain?      Previous bleeding on back from internal hemorrhoid   MEDICINES: What have you taken so far for the pain? (e.g., nothing, acetaminophen, NSAIDS)     Advil  NEUROLOGIC SYMPTOMS: Do you have any weakness, numbness, or problems with bowel/bladder control?     Difficulty with bowel movements, weakness in legs due to pain  OTHER SYMPTOMS: Do you have any other symptoms? (e.g., fever, abdomen pain, burning with urination, blood in urine)       Coughing some the other days, trouble holding bowels  Protocols used: Back Pain-A-AH

## 2024-01-09 ENCOUNTER — Ambulatory Visit: Payer: Self-pay

## 2024-01-09 NOTE — Telephone Encounter (Signed)
 FYI Only or Action Required?: FYI only for provider.  Patient was last seen in primary care on 11/02/2023 by Jimmy Charlie FERNS, MD.  Called Nurse Triage reporting Urinary Frequency.  Symptoms began several days ago.  Interventions attempted: OTC medications: tylenol  and Rest, hydration, or home remedies.  Symptoms are: gradually worsening.  Triage Disposition: See HCP Within 4 Hours (Or PCP Triage)  Patient/caregiver understands and will follow disposition?: No, refuses disposition RN offered visit at [facility] today, patient says that she is off today and has trouble with transportation and would prefer to be seen at [facility] tomorrow.  RN advised possible severity of symptoms. Pt verbalized understanding, says that she is aware of her risks of waiting to be seen, and while she appreciates the effort to expedite her care, she would prefer to be seen by Dr. Randeen tomorrow morning. Appt scheduled for 9/10 at 0900. Care advice provided, pt verbalizes understanding  Copied from CRM #[id number]. Topic: Clinical - Red Word Triage >> Jan 09, 2024  9:00 AM Revonda D wrote: Red Word that prompted transfer to Nurse Triage: pain   Pt stated that she is experiencing pain and frequent urination. Pt stated that she would like to get lab orders submitted for a urine culture and pregnancy test. Pt stated that she is not sure if she is pregnant or if she has a UTI but would like to come into the lab today if possible. Reason for Disposition  Side (flank) or lower back pain present  Answer Assessment - Initial Assessment Questions 1. SYMPTOM: What's the main symptom you're concerned about? (e.g., frequency, incontinence)     Frequent urination  2. ONSET: When did the  frequent urination  start?     4 days no  3. PAIN: Is there any pain? If Yes, ask: How bad is it? (Scale: 1-10; mild, moderate, severe)     Moderate to severe  4. CAUSE: What do you think is causing the symptoms?     Concerned for UTI and/or  pregnancy  5. OTHER SYMPTOMS: Do you have any other symptoms? (e.g., blood in urine, fever, flank pain, pain with urination)     Abdominal swelling, pain with urination, low back pain,   6. PREGNANCY: Is there any chance you are pregnant? When was your last menstrual period?     Unsure, but on left ovary it feels like sharp pains. Cannot recall LMP  Protocols used: Urinary Symptoms-A-AH

## 2024-01-10 ENCOUNTER — Encounter: Payer: Self-pay | Admitting: Family Medicine

## 2024-01-10 ENCOUNTER — Ambulatory Visit (INDEPENDENT_AMBULATORY_CARE_PROVIDER_SITE_OTHER): Admitting: Family Medicine

## 2024-01-10 VITALS — BP 118/84 | HR 72 | Temp 98.2°F | Ht 63.0 in | Wt 205.0 lb

## 2024-01-10 DIAGNOSIS — N3001 Acute cystitis with hematuria: Secondary | ICD-10-CM

## 2024-01-10 DIAGNOSIS — K219 Gastro-esophageal reflux disease without esophagitis: Secondary | ICD-10-CM

## 2024-01-10 DIAGNOSIS — R300 Dysuria: Secondary | ICD-10-CM | POA: Diagnosis not present

## 2024-01-10 DIAGNOSIS — N390 Urinary tract infection, site not specified: Secondary | ICD-10-CM | POA: Insufficient documentation

## 2024-01-10 LAB — POCT URINALYSIS DIPSTICK
Bilirubin, UA: NEGATIVE
Glucose, UA: NEGATIVE
Ketones, UA: NEGATIVE
Nitrite, UA: POSITIVE
Protein, UA: NEGATIVE
Spec Grav, UA: 1.015 (ref 1.010–1.025)
Urobilinogen, UA: 0.2 U/dL
pH, UA: 6 (ref 5.0–8.0)

## 2024-01-10 MED ORDER — NITROFURANTOIN MONOHYD MACRO 100 MG PO CAPS: 100.0000 mg | ORAL_CAPSULE | Freq: Two times a day (BID) | ORAL | 0 refills | Status: DC

## 2024-01-10 MED ORDER — OMEPRAZOLE 20 MG PO CPDR: 20.0000 mg | DELAYED_RELEASE_CAPSULE | Freq: Every day | ORAL | 0 refills | Status: AC

## 2024-01-10 NOTE — Progress Notes (Signed)
 Subjective:    Patient ID: [name], female    DOB: [birth year], 52 y.o.   MRN: [id number]  HPI  Wt Readings from Last 3 Encounters:  01/10/24 205 lb (93 kg)  11/02/23 210 lb 2 oz (95.3 kg)  07/18/23 210 lb (95.3 kg)   36.31 kg/m  Vitals:   01/10/24 0905  BP: 118/84  Pulse: 72  Temp: 98.2 F (36.8 C)  SpO2: 97%    53 yo pt of NP Vincente presents with urinary symptoms   Frequent urination  Especially at night Dysuria since Friday  Some sharp back pain - both sides but a bit worse on the left  Maybe tiny bit of pink urine this weekend   Some water Some cranberry juice   Some nausea  No vomiting  Was a little dizzy one day   No menses/menopausal   Took azo (was labeled for yeast)   Pmx notable for past bladder surgery  Also endometriosis   Results for orders placed or performed in visit on 01/10/24  POCT Urinalysis Dipstick   Collection Time: 01/10/24  9:17 AM  Result Value Ref Range   Color, UA Dark Yellow    Clarity, UA Cloudy    Glucose, UA Negative Negative   Bilirubin, UA Negative    Ketones, UA Negative    Spec Grav, UA 1.015 1.010 - 1.025   Blood, UA 1+    pH, UA 6.0 5.0 - 8.0   Protein, UA Negative Negative   Urobilinogen, UA 0.2 0.2 or 1.0 E.U./dL   Nitrite, UA Positive    Leukocytes, UA Large (3+) (A) Negative   Appearance     Odor      BMET    Component Value Date/Time   NA 140 07/18/2023 0916   NA 139 05/09/2019 1417   NA 143 08/12/2011 1231   K 4.4 07/18/2023 0916   K 3.9 08/12/2011 1231   CL 104 07/18/2023 0916   CL 109 (H) 08/12/2011 1231   CO2 27 07/18/2023 0916   CO2 21 08/12/2011 1231   GLUCOSE 78 07/18/2023 0916   GLUCOSE 83 08/12/2011 1231   BUN 16 07/18/2023 0916   BUN 13 05/09/2019 1417   BUN 8 08/12/2011 1231   CREATININE 0.74 07/18/2023 0916   CREATININE 0.76 08/12/2011 1231   CALCIUM 9.5 07/18/2023 0916   CALCIUM 8.4 (L) 08/12/2011 1231   GFRNONAA >60 06/28/2023 1036   GFRNONAA >60 08/12/2011 1231       Patient Active Problem List   Diagnosis Date Noted   UTI (urinary tract infection) 01/10/2024   Sore throat 11/02/2023   Establishing care with new doctor, encounter for 07/18/2023   Class 2 obesity due to excess calories without serious comorbidity with body mass index (BMI) of 37.0 to 37.9 in adult 07/18/2023   Chronic anemia 07/18/2023   Encounter for screening and preventative care 07/18/2023   Vitamin D  deficiency 07/18/2023   Screen for colon cancer 11/28/2022   Fatigue 04/11/2012   GERD (gastroesophageal reflux disease) 04/05/2012   Nasal congestion 04/05/2012   Rash 04/05/2012   Right shoulder injury 04/05/2012   Shoulder pain 04/05/2012   Dysuria 03/21/2012   Painful bladder spasm 03/21/2012   Pelvic pain in female 11/26/2010   Endometriosis 11/26/2010   Constipation 11/26/2010   Cystocele 11/26/2010   Rectocele 11/26/2010   Past Medical History:  Diagnosis Date   Allergy    Arthritis    Asthma    Bladder disorder  Bladder infection    Constipation    Eczema    Endometriosis    GERD (gastroesophageal reflux disease)    Ovarian cyst    Stroke [facility])    Thyroid  disease    UTI (lower urinary tract infection)    Past Surgical History:  Procedure Laterality Date   BLADDER SURGERY     COLONOSCOPY WITH PROPOFOL  N/A 11/28/2022   Procedure: COLONOSCOPY WITH PROPOFOL ;  Surgeon: Unk Corinn Skiff, MD;  Location: [facility] ENDOSCOPY;  Service: Gastroenterology;  Laterality: N/A;   LAPAROSCOPY     TUBAL LIGATION     Social History   Tobacco Use   Smoking status: Every Day    Current packs/day: 0.50    Average packs/day: 0.5 packs/day for 20.0 years (10.0 ttl pk-yrs)    Types: Cigarettes   Smokeless tobacco: Never  Vaping Use   Vaping status: Never Used  Substance Use Topics   Alcohol use: Yes    Alcohol/week: 4.0 standard drinks of alcohol    Types: 2 Glasses of wine, 2 Cans of beer per week    Comment: 3 x per week   Drug use: No   Family History   Problem Relation Age of Onset   Stroke Mother    Hypertension Mother    Arthritis Mother    GER disease Mother    Heart attack Mother    Hyperlipidemia Father    Heart attack Father    Heart disease Father    GER disease Father    Hypertension Father    GER disease Sister    GER disease Brother    Cancer Maternal Grandmother    Hyperlipidemia Maternal Grandmother    Heart attack Maternal Grandmother    Hearing loss Maternal Grandfather    Heart attack Maternal Grandfather    Stroke Paternal Grandmother    Hyperlipidemia Paternal Grandmother    Heart attack Paternal Grandmother    Hearing loss Paternal Grandfather    Allergies  Allergen Reactions   Codeine Anaphylaxis    Shortness of breath   Elemental Sulfur Rash   Penicillins Itching and Other (See Comments)    Causes yeast infections palpatations    Sulfa Antibiotics Rash   Sulfamethoxazole Itching and Other (See Comments)    Causes yeast infections   No current outpatient medications on file prior to visit.   No current facility-administered medications on file prior to visit.    Review of Systems  Constitutional:  Positive for fatigue. Negative for activity change, appetite change and fever.  HENT:  Negative for congestion and sore throat.   Eyes:  Negative for itching and visual disturbance.  Respiratory:  Negative for cough and shortness of breath.   Cardiovascular:  Negative for leg swelling.  Gastrointestinal:  Negative for abdominal distention, abdominal pain, constipation, diarrhea and nausea.  Endocrine: Negative for cold intolerance and polydipsia.  Genitourinary:  Positive for dysuria, frequency and urgency. Negative for difficulty urinating, flank pain and hematuria.  Musculoskeletal:  Positive for back pain. Negative for myalgias.  Skin:  Negative for rash.  Allergic/Immunologic: Negative for immunocompromised state.  Neurological:  Negative for dizziness and weakness.  Hematological:  Negative  for adenopathy.       Objective:   Physical Exam Constitutional:      General: She is not in acute distress.    Appearance: She is well-developed.  HENT:     Head: Normocephalic and atraumatic.  Eyes:     Conjunctiva/sclera: Conjunctivae normal.     Pupils: Pupils  are equal, round, and reactive to light.  Cardiovascular:     Rate and Rhythm: Normal rate and regular rhythm.     Heart sounds: Normal heart sounds.  Pulmonary:     Effort: Pulmonary effort is normal.     Breath sounds: Normal breath sounds.  Abdominal:     General: Bowel sounds are normal. There is no distension.     Palpations: Abdomen is soft.     Tenderness: There is abdominal tenderness. There is no rebound.     Comments: No cva tenderness  Mild suprapubic tenderness without fullness   Musculoskeletal:     Cervical back: Normal range of motion and neck supple.     Comments: Some tenderness of low back bilaterally - under CVA area   Lymphadenopathy:     Cervical: No cervical adenopathy.  Skin:    Findings: No rash.  Neurological:     Mental Status: She is alert.           Assessment & Plan:   Problem List Items Addressed This Visit       Digestive   GERD (gastroesophageal reflux disease)   Pt asked for refill of omeprazole  20 mg to get by until she follows up with pcp       Relevant Medications   omeprazole  (PRILOSEC) 20 MG capsule     Genitourinary   UTI (urinary tract infection)   With mild hematuria (was visible one day)  Micro with wbc and nitrite and blood   Prescription macrobid  given Encouraged water intake  Culture pending  Handout given Instructed to call if symptoms worsen before culture returns   Call back and Er precautions noted in detail today        Relevant Medications   nitrofurantoin , macrocrystal-monohydrate, (MACROBID ) 100 MG capsule   Other Relevant Orders   Urine Culture     Other   Dysuria - Primary   Relevant Orders   POCT Urinalysis Dipstick  (Completed)

## 2024-01-10 NOTE — Patient Instructions (Addendum)
 Drink lots of water  Take the generic macrobid  as directed  We will reach out with culture result and plan when it returns  In the meantime if symptoms worsen - please call

## 2024-01-10 NOTE — Assessment & Plan Note (Signed)
 Pt asked for refill of omeprazole  20 mg to get by until she follows up with pcp

## 2024-01-10 NOTE — Assessment & Plan Note (Signed)
 With mild hematuria (was visible one day)  Micro with wbc and nitrite and blood   Prescription macrobid  given Encouraged water intake  Culture pending  Handout given Instructed to call if symptoms worsen before culture returns   Call back and Er precautions noted in detail today

## 2024-01-12 ENCOUNTER — Ambulatory Visit: Payer: Self-pay | Admitting: Family Medicine

## 2024-01-12 LAB — URINE CULTURE
MICRO NUMBER:: 16949286
SPECIMEN QUALITY:: ADEQUATE

## 2024-02-12 ENCOUNTER — Telehealth: Payer: Self-pay | Admitting: General Practice

## 2024-02-12 NOTE — Telephone Encounter (Signed)
 Copied from CRM [id number]. Topic: Clinical - Prescription Issue >> Feb 12, 2024  8:34 AM Aleatha C wrote: Reason for CRM: Patient wasn't able to get her medication for her UTI because of finances, but she is still having issues she wanted to know if she can get just a one time pill instead of    Patient was seen over a month ago with Dr. Randeen on 9/10.

## 2024-02-12 NOTE — Telephone Encounter (Signed)
 Patient has to have a visit for tx; we do not give meds without an appt. Please call patient to schedule an appt

## 2024-02-12 NOTE — Telephone Encounter (Signed)
 Patient scheduled appt with Carrol Aurora, NP on 02/14/24

## 2024-02-12 NOTE — Telephone Encounter (Signed)
 Pt came by and asked if she can have her uti medications be filled for a one time supply. She would like a call back if there's any updates or concerns.

## 2024-02-14 ENCOUNTER — Ambulatory Visit: Admitting: General Practice

## 2024-02-14 DIAGNOSIS — R300 Dysuria: Secondary | ICD-10-CM

## 2024-02-21 ENCOUNTER — Ambulatory Visit

## 2024-02-21 DIAGNOSIS — N3000 Acute cystitis without hematuria: Secondary | ICD-10-CM

## 2024-02-21 DIAGNOSIS — R300 Dysuria: Secondary | ICD-10-CM | POA: Diagnosis not present

## 2024-02-21 LAB — POCT URINALYSIS DIPSTICK
Nitrite, UA: POSITIVE
Spec Grav, UA: 1.01 (ref 1.010–1.025)
pH, UA: 6.5 (ref 5.0–8.0)

## 2024-02-21 MED ORDER — CIPROFLOXACIN HCL 500 MG PO TABS: 500.0000 mg | ORAL_TABLET | Freq: Two times a day (BID) | ORAL | 0 refills | Status: AC

## 2024-02-21 NOTE — Addendum Note (Signed)
 Addended by: HERCHEL GRUMET A on: 02/21/2024 01:28 PM   Modules accepted: Orders

## 2024-02-21 NOTE — Progress Notes (Signed)
 SUBJECTIVE: [name] is a 53 y.o. female who complains of dysuria without flank pain, fever, chills, or abnormal vaginal discharge or bleeding.   OBJECTIVE: Appears well, in no apparent distress.  Vital signs are normal. Urine dipstick shows positive for RBC's, positive for nitrates, and positive for leukocytes.    ASSESSMENT: Dysuria  PLAN: Treatment per orders.  Call or return to clinic prn if these symptoms worsen or fail to improve as anticipated.

## 2024-02-27 LAB — URINE CULTURE

## 2024-02-28 ENCOUNTER — Ambulatory Visit: Payer: Self-pay | Admitting: Obstetrics & Gynecology

## 2024-04-23 ENCOUNTER — Ambulatory Visit

## 2024-04-23 DIAGNOSIS — R399 Unspecified symptoms and signs involving the genitourinary system: Secondary | ICD-10-CM

## 2024-04-23 LAB — POCT URINALYSIS DIPSTICK

## 2024-04-23 MED ORDER — NITROFURANTOIN MONOHYD MACRO 100 MG PO CAPS: 100.0000 mg | ORAL_CAPSULE | Freq: Two times a day (BID) | ORAL | 1 refills | Status: DC

## 2024-04-23 MED ORDER — FLUCONAZOLE 150 MG PO TABS: 150.0000 mg | ORAL_TABLET | Freq: Once | ORAL | 0 refills | Status: AC

## 2024-04-23 NOTE — Progress Notes (Addendum)
 SUBJECTIVE:  53 y.o. female complains of urinary frequency x couple of days  Denies abnormal vaginal bleeding or significant pelvic pain or fever.Denies history of known exposure to STD.  No LMP recorded. (Menstrual status: Bilateral Tubal Ligation).  OBJECTIVE:  She appears alert, well appearing, in no apparent distress Urine dipstick: positive for nitrates and positive for leukocytes.  ASSESSMENT:   Dysuria    PLAN:   urine culture sent to lab. Treatment: Pt requesting diflucan  and uti meds sent to pharmacy due to holiday  ROV prn if symptoms persist or worsen.

## 2024-04-28 ENCOUNTER — Ambulatory Visit: Payer: Self-pay | Admitting: Family Medicine

## 2024-04-28 LAB — URINE CULTURE

## 2024-05-03 ENCOUNTER — Ambulatory Visit (INDEPENDENT_AMBULATORY_CARE_PROVIDER_SITE_OTHER)

## 2024-05-03 VITALS — BP 110/70 | HR 75 | Temp 97.7°F | Ht 63.0 in | Wt 207.0 lb

## 2024-05-03 DIAGNOSIS — R21 Rash and other nonspecific skin eruption: Secondary | ICD-10-CM | POA: Diagnosis not present

## 2024-05-03 DIAGNOSIS — L658 Other specified nonscarring hair loss: Secondary | ICD-10-CM

## 2024-05-03 DIAGNOSIS — L989 Disorder of the skin and subcutaneous tissue, unspecified: Secondary | ICD-10-CM | POA: Diagnosis not present

## 2024-05-03 MED ORDER — TRIAMCINOLONE ACETONIDE 0.1 % EX CREA: 1.0000 | TOPICAL_CREAM | Freq: Two times a day (BID) | CUTANEOUS | 0 refills | Status: AC

## 2024-05-03 MED ORDER — MINOXIDIL 2 % EX SOLN: CUTANEOUS | 3 refills | Status: DC

## 2024-05-03 NOTE — Patient Instructions (Signed)
 Thank you for visiting [facility] today! Here's what we talked about: - START Kenalog cream twice daily for 14 days then afterwards only as needed if rash returns -Call Dermatology if you do not hear from them in 2 weeks

## 2024-05-03 NOTE — Progress Notes (Signed)
 "  Subjective:   This visit was conducted in person. The patient gave informed consent to the use of Abridge AI technology to record the contents of the encounter as documented below.   Patient ID: [name], female    DOB: 1971/01/25, 54 y.o.   MRN: [id number]   Discussed the use of AI scribe software for clinical note transcription with the patient, who gave verbal consent to proceed.  History of Present Illness [name] is a 54 year old female who presents with persistent scalp and leg lesions.  She has a persistent lesion on her left leg, first noticed on December 30th, described as a small patch with tiny, white, bubble-like appearances similar to sunburn blisters. The lesion is itchy but not painful, and it has not increased in size, remaining about the size of a quarter to a fifty-cent piece. This type of lesion has appeared and disappeared over the years, but this time it has persisted longer. She has used over-the-counter itching creams and coconut oil for relief, which have been somewhat effective. The itching is more pronounced at night.  She also has a long-standing issue with a lesion on the left side of her scalp, which has been present for years. This lesion sometimes becomes a sore when scratched, leading to bleeding. It is described as dark red to dark brown or black when scabbed. She is concerned about the potential for skin cancer, given her family history of skin cancer on both sides of her family, including her paternal grandmother who had cancerous lesions removed from her face. The scalp lesion is associated with hair thinning, and she reports a change in hair growth patterns, including a persistent cowlick and hair loss when pulling on her hair. There are multiple small lesions on the scalp along with a larger one.  She has a history of anemia, which was diagnosed by a previous doctor, and experiences fatigue, particularly between 2 to 4 PM daily. No  recent weight loss, but she notes weight gain and abdominal swelling, which she attributes to her ovaries.  She smokes cigarettes and does not use sunscreen.           Review of Systems  All other systems reviewed and are negative.       Allergies[1]  Medications Ordered Prior to Encounter[2]  BP 110/70 (BP Location: Left Arm, Patient Position: Sitting, Cuff Size: Large)   Pulse 75   Temp 97.7 F (36.5 C) (Oral)   Ht 5' 3 (1.6 m)   Wt 207 lb (93.9 kg)   SpO2 95%   BMI 36.67 kg/m   Objective:      Physical Exam GENERAL: Alert, cooperative, well developed, no acute distress. HEAD: Normocephalic atraumatic. EYES: Extraocular movements intact bilaterally, pupils round, equal and reactive to light bilaterally, conjunctivae normal bilaterally. SKIN: Scab present on left frontal scalp, notable hair thinning, with scalp more visible in the vertex see media.  1 x 1.5 cm rash on left lateral posterior thigh, erythematous with whitish scale.  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 Family history of skin cancer, smoking history, lack of sunscreen use, and the fact that the lesion is nonhealing raises malignancy concern, will refer to Derm.  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  Per chart review, recent TSH was WNL. No distinct bald patches, low suspicion for true alopecia at this time. - Referred to dermatologist for further evaluation. - Advised taking pictures of lesion during flare-ups for review. - Prescribed minoxidil for hair thinning, use consistently for 3-4 months, plan for PCP follow-up at this time to monitor progress, if effective, continue, but if no improvement,  consider alternative interventions such as low-dose spironolactone for antiandrogenic effect in addition to the minoxidil.  Rash Chronic pruritic lesion on left leg, persistent and non-painful, no recent changes in skin products.  Possible differential psoriasis, treating with steroid cream mid potency, counseled about potential side effects, she is agreeable to start. - Start Kenalog twice daily for 14 days, and thereafter, use only as needed for flareups.   Return in about 4 months (around 08/31/2024) for Hair thinning with PCP.   Lauren Albaugh K Brenan [place], MD  05/03/2024     Contains text generated by Abridge.        [1]  Allergies Allergen Reactions   Codeine Anaphylaxis    Shortness of breath   Elemental Sulfur Rash   Penicillins Itching and Other (See Comments)    Causes yeast infections palpatations    Sulfa Antibiotics Rash   Sulfamethoxazole Itching and Other (See Comments)    Causes yeast infections  [2]  Current Outpatient Medications on File Prior to Visit  Medication Sig Dispense Refill   omeprazole  (PRILOSEC) 20 MG capsule Take 1 capsule (20 mg total) by mouth daily. Empty stomach 30 capsule 0   nitrofurantoin , macrocrystal-monohydrate, (MACROBID ) 100 MG capsule Take 1 capsule (100 mg total) by mouth 2 (two) times daily. (Patient not taking: Reported on 05/03/2024) 14 capsule 1   No current facility-administered medications on file prior to visit.   "

## 2024-05-06 ENCOUNTER — Telehealth: Payer: Self-pay | Admitting: General Practice

## 2024-05-06 NOTE — Telephone Encounter (Signed)
 Patient states the cream prescribed for her scalp is not covered by insurance.  Ask for another rx to be call.

## 2024-05-09 ENCOUNTER — Ambulatory Visit

## 2024-05-10 NOTE — Telephone Encounter (Signed)
 Called and discussed at length.  Patient will schedule f/u with me next week.   Jenette Aurora, DNP, AGNP-C 05/10/2024 4:31 PM

## 2024-05-22 ENCOUNTER — Ambulatory Visit: Admitting: General Practice

## 2024-05-28 ENCOUNTER — Ambulatory Visit: Admitting: General Practice

## 2024-06-04 ENCOUNTER — Ambulatory Visit: Admitting: General Practice

## 2024-06-05 ENCOUNTER — Ambulatory Visit

## 2024-06-06 ENCOUNTER — Ambulatory Visit: Admitting: General Practice

## 2024-06-06 ENCOUNTER — Ambulatory Visit: Payer: Self-pay | Admitting: General Practice

## 2024-06-06 ENCOUNTER — Ambulatory Visit
Admission: RE | Admit: 2024-06-06 | Discharge: 2024-06-06 | Disposition: A | Source: Ambulatory Visit | Attending: General Practice | Admitting: General Practice

## 2024-06-06 ENCOUNTER — Encounter: Payer: Self-pay | Admitting: General Practice

## 2024-06-06 VITALS — BP 118/82 | HR 60 | Temp 97.4°F | Ht 63.0 in | Wt 210.0 lb

## 2024-06-06 DIAGNOSIS — R0602 Shortness of breath: Secondary | ICD-10-CM | POA: Diagnosis not present

## 2024-06-06 DIAGNOSIS — E6609 Other obesity due to excess calories: Secondary | ICD-10-CM

## 2024-06-06 DIAGNOSIS — F17200 Nicotine dependence, unspecified, uncomplicated: Secondary | ICD-10-CM | POA: Insufficient documentation

## 2024-06-06 DIAGNOSIS — R079 Chest pain, unspecified: Secondary | ICD-10-CM

## 2024-06-06 DIAGNOSIS — L658 Other specified nonscarring hair loss: Secondary | ICD-10-CM

## 2024-06-06 DIAGNOSIS — J449 Chronic obstructive pulmonary disease, unspecified: Secondary | ICD-10-CM

## 2024-06-06 LAB — COMPREHENSIVE METABOLIC PANEL WITH GFR
ALT: 17 U/L (ref 3–35)
AST: 14 U/L (ref 5–37)
Albumin: 4 g/dL (ref 3.5–5.2)
Alkaline Phosphatase: 53 U/L (ref 39–117)
BUN: 14 mg/dL (ref 6–23)
CO2: 30 meq/L (ref 19–32)
Calcium: 9.1 mg/dL (ref 8.4–10.5)
Chloride: 105 meq/L (ref 96–112)
Creatinine, Ser: 0.7 mg/dL (ref 0.40–1.20)
GFR: 98.06 mL/min
Glucose, Bld: 74 mg/dL (ref 70–99)
Potassium: 4 meq/L (ref 3.5–5.1)
Sodium: 141 meq/L (ref 135–145)
Total Bilirubin: 0.3 mg/dL (ref 0.2–1.2)
Total Protein: 6.7 g/dL (ref 6.0–8.3)

## 2024-06-06 LAB — LIPID PANEL
Cholesterol: 137 mg/dL (ref 28–200)
HDL: 52.9 mg/dL
LDL Cholesterol: 73 mg/dL (ref 10–99)
NonHDL: 84.25
Total CHOL/HDL Ratio: 3
Triglycerides: 58 mg/dL (ref 10.0–149.0)
VLDL: 11.6 mg/dL (ref 0.0–40.0)

## 2024-06-06 LAB — CBC
HCT: 38.9 % (ref 36.0–46.0)
Hemoglobin: 13.2 g/dL (ref 12.0–15.0)
MCHC: 34.1 g/dL (ref 30.0–36.0)
MCV: 95.1 fl (ref 78.0–100.0)
Platelets: 252 10*3/uL (ref 150.0–400.0)
RBC: 4.09 Mil/uL (ref 3.87–5.11)
RDW: 13.4 % (ref 11.5–15.5)
WBC: 9 10*3/uL (ref 4.0–10.5)

## 2024-06-06 LAB — HEMOGLOBIN A1C: Hgb A1c MFr Bld: 5.5 % (ref 4.6–6.5)

## 2024-06-06 LAB — TSH: TSH: 0.99 u[IU]/mL (ref 0.35–5.50)

## 2024-06-06 MED ORDER — FINASTERIDE 5 MG PO TABS: 5.0000 mg | ORAL_TABLET | Freq: Every day | ORAL | 0 refills | Status: AC

## 2024-06-06 NOTE — Progress Notes (Signed)
 "  Established Patient Office Visit  Subjective   Patient ID: [name], female    DOB: Oct 22, 1970  Age: 54 y.o. MRN: [id number]  Chief Complaint  Patient presents with   Alopecia    Patient needs new rx for hair loss. Patients insurance did not cover the rogaine .    Chest Pain    Patient has had some chest pains and tightness this week. Her dad passed away about last week and has been under a lot of stress.     Chest Pain  Associated symptoms include shortness of breath. Pertinent negatives include no abdominal pain, cough, dizziness, fever, headaches, nausea or vomiting.    Discussed the use of AI scribe software for clinical note transcription with the patient, who gave verbal consent to proceed.  History of Present Illness [name] Charlies is a 53 year old female with COPD who presents with new onset chest pain and follow-up for alopecia.  She has been experiencing new onset chest pain and tightness over the past one to two weeks, with soreness in her chest and tightness in her shoulders and arms, predominantly on the right side. She reports the recent loss of her father, who had a history of heart attacks. She also experiences shortness of breath and has a history of COPD. She smokes. Her family history is significant for heart disease, with her father and other relatives having suffered heart attacks.  She has been dealing with alopecia and was previously seen by Dr. Bennett, who recommended minoxidil  (Rogaine ). However, her insurance did not cover the medication, and she has not obtained it due to cost. A referral to dermatology was made but not followed up due to communication issues. She has a history of eczema and suspects psoriasis, with lesions noted on her scalp and back of her leg.     Patient Active Problem List   Diagnosis Date Noted   Smoker 06/06/2024   UTI (urinary tract infection) 01/10/2024   Sore throat 11/02/2023   Establishing care with new  doctor, encounter for 07/18/2023   Class 2 obesity due to excess calories without serious comorbidity with body mass index (BMI) of 37.0 to 37.9 in adult 07/18/2023   Chronic anemia 07/18/2023   Encounter for screening and preventative care 07/18/2023   Vitamin D  deficiency 07/18/2023   Screen for colon cancer 11/28/2022   Fatigue 04/11/2012   GERD (gastroesophageal reflux disease) 04/05/2012   Nasal congestion 04/05/2012   Rash 04/05/2012   Right shoulder injury 04/05/2012   Shoulder pain 04/05/2012   Dysuria 03/21/2012   Painful bladder spasm 03/21/2012   Pelvic pain in female 11/26/2010   Endometriosis 11/26/2010   Constipation 11/26/2010   Cystocele 11/26/2010   Rectocele 11/26/2010   Past Medical History:  Diagnosis Date   Allergy    Arthritis    Asthma    Bladder disorder    Bladder infection    Constipation    Eczema    Endometriosis    GERD (gastroesophageal reflux disease)    Ovarian cyst    Stroke (HCC)    Thyroid  disease    UTI (lower urinary tract infection)    Past Surgical History:  Procedure Laterality Date   BLADDER SURGERY     COLONOSCOPY WITH PROPOFOL  N/A 11/28/2022   Procedure: COLONOSCOPY WITH PROPOFOL ;  Surgeon: Unk Lauren Skiff, MD;  Location: [facility] ENDOSCOPY;  Service: Gastroenterology;  Laterality: N/A;   LAPAROSCOPY     TUBAL LIGATION     Allergies[1]  06/06/2024   11:53 AM 05/03/2024    9:28 AM 01/10/2024    9:07 AM  Depression screen PHQ 2/9  Decreased Interest 0 0 2  Down, Depressed, Hopeless 0 0 0  PHQ - 2 Score 0 0 2  Altered sleeping 2  2  Tired, decreased energy 3  2  Change in appetite 1  2  Feeling bad or failure about yourself  1  0  Trouble concentrating 0  0  Moving slowly or fidgety/restless 0  1  Suicidal thoughts 0  0  PHQ-9 Score 7  9   Difficult doing work/chores Not difficult at all  Somewhat difficult     Data saved with a previous flowsheet row definition       06/06/2024   11:54 AM 01/10/2024    9:08 AM  11/02/2023    8:50 AM 07/18/2023   11:17 AM  GAD 7 : Generalized Anxiety Score  Nervous, Anxious, on Edge 2 1  2   0   Control/stop worrying 1 1  2   0   Worry too much - different things 2 1  2   0   Trouble relaxing 3 1  2   0   Restless 2 0  1  0   Easily annoyed or irritable 1 0  2  0   Afraid - awful might happen 1 0  1  0   Total GAD 7 Score 12 4 12  0  Anxiety Difficulty Not difficult at all Somewhat difficult Somewhat difficult Not difficult at all     Data saved with a previous flowsheet row definition      Review of Systems  Constitutional:  Negative for chills and fever.  Respiratory:  Positive for shortness of breath. Negative for cough and wheezing.   Cardiovascular:  Positive for chest pain.  Gastrointestinal:  Negative for abdominal pain, constipation, diarrhea, heartburn, nausea and vomiting.  Genitourinary:  Negative for dysuria, frequency and urgency.  Neurological:  Negative for dizziness and headaches.  Endo/Heme/Allergies:  Negative for polydipsia.  Psychiatric/Behavioral:  Negative for depression and suicidal ideas. The patient is not nervous/anxious.       Objective:     BP 118/82   Pulse 60   Temp (!) 97.4 F (36.3 C) (Temporal)   Ht 5' 3 (1.6 m)   Wt 210 lb (95.3 kg)   SpO2 99%   BMI 37.20 kg/m  BP Readings from Last 3 Encounters:  06/06/24 118/82  05/03/24 110/70  01/10/24 118/84   Wt Readings from Last 3 Encounters:  06/06/24 210 lb (95.3 kg)  05/03/24 207 lb (93.9 kg)  01/10/24 205 lb (93 kg)      Physical Exam Vitals and nursing note reviewed.  Constitutional:      Appearance: Normal appearance.  Cardiovascular:     Rate and Rhythm: Normal rate and regular rhythm.     Pulses: Normal pulses.     Heart sounds: Normal heart sounds.  Pulmonary:     Effort: Pulmonary effort is normal.     Breath sounds: Normal breath sounds.  Neurological:     Mental Status: She is alert and oriented to person, place, and time.  Psychiatric:         Mood and Affect: Mood normal.        Behavior: Behavior normal.        Thought Content: Thought content normal.        Judgment: Judgment normal.      No results found for  any visits on 06/06/24.     The ASCVD Risk score (Arnett DK, et al., 2019) failed to calculate for the following reasons:   Risk score cannot be calculated because patient has a medical history suggesting prior/existing ASCVD   * - Cholesterol units were assumed    Assessment & Plan:  Chest pain, unspecified type -     EKG 12-Lead -     CBC -     Comprehensive metabolic panel with GFR -     TSH -     Hemoglobin A1c -     Lipid panel  Female pattern baldness -     Finasteride ; Take 1 tablet (5 mg total) by mouth daily.  Dispense: 30 tablet; Refill: 0 -     Ambulatory referral to Dermatology  Shortness of breath -     DG Chest 2 View  Chronic obstructive pulmonary disease, unspecified COPD type (HCC)  Class 2 obesity due to excess calories without serious comorbidity with body mass index (BMI) of 37.0 to 37.9 in adult  Smoker    Assessment and Plan Assessment & Plan Female pattern hair loss Minoxidil  not covered by insurance. Dermatology referral closed due to lack of contact. Discussed finasteride  as an alternative, noting potential urinary frequency increase. - Reopened dermatology referral, instructed to provide address for urgent referral. - Prescribed finasteride , sent to pharmacy. - Follow up in two weeks to assess response.  Chest pain and dyspnea Intermittent chest pain and tightness, possibly stress-related. Smoking and family history increase cardiac risk. EKG normal. - Ordered blood work, EKG, and chest x-ray. - EKG tracing is personally reviewed.  EKG notes sinus bradycardia.  No acute changes.   - Advised ER visit if symptoms persist and tests are negative. - Counseled on smoking cessation.  Chronic obstructive pulmonary disease COPD may contribute to symptoms. Smoking cessation  crucial. - Counseled on smoking cessation.  Class 2 obesity due to excess calories BMI 37, indicating class 2 obesity. Discussed weight loss to reduce health risks. - Encouraged lifestyle modifications including diet and exercise. - Will reassess weight and BMI at follow-up.  Nicotine dependence, cigarettes Smoking increases risk for heart attack and stroke, especially with family history. - Smoking cessation instruction/counseling given:  counseled patient on the dangers of tobacco use, advised patient to stop smoking, and reviewed strategies to maximize success    Return in about 2 weeks (around 06/20/2024) for alopecia. SABRA Carrol Aurora, NP     [1]  Allergies Allergen Reactions   Codeine Anaphylaxis    Shortness of breath   Elemental Sulfur Rash   Penicillins Itching and Other (See Comments)    Causes yeast infections palpatations    Sulfa Antibiotics Rash   Sulfamethoxazole Itching and Other (See Comments)    Causes yeast infections   "

## 2024-06-06 NOTE — Patient Instructions (Addendum)
 Stop by the lab prior to leaving today. I will notify you of your results once received.   Complete xray(s) prior to leaving today. I will notify you of your results once received.   As discussed cut back on smoking.   Start finasteride  once daily for the hair.   You will either be contacted via phone regarding your referral to dermatology, or you may receive a letter on your MyChart portal from our referral team with instructions for scheduling an appointment. Please let us  know if you have not been contacted by anyone within two weeks.  Let me know if you need me to change the dermatology referral location.   Follow up in 2 weeks.   It was a pleasure to see you today!

## 2024-06-11 ENCOUNTER — Ambulatory Visit

## 2024-06-25 ENCOUNTER — Ambulatory Visit: Admitting: General Practice
